# Patient Record
Sex: Female | Born: 1975 | Race: Black or African American | Hispanic: No | Marital: Married | Smoking: Never smoker
Health system: Southern US, Community
[De-identification: ages and names within clinical notes are randomized; demographics above are authoritative.]

## PROBLEM LIST (undated history)

## (undated) DIAGNOSIS — I10 Essential (primary) hypertension: Secondary | ICD-10-CM

## (undated) DIAGNOSIS — D219 Benign neoplasm of connective and other soft tissue, unspecified: Secondary | ICD-10-CM

## (undated) DIAGNOSIS — N920 Excessive and frequent menstruation with regular cycle: Secondary | ICD-10-CM

## (undated) DIAGNOSIS — E669 Obesity, unspecified: Secondary | ICD-10-CM

## (undated) DIAGNOSIS — R232 Flushing: Secondary | ICD-10-CM

## (undated) DIAGNOSIS — J329 Chronic sinusitis, unspecified: Secondary | ICD-10-CM

## (undated) DIAGNOSIS — E785 Hyperlipidemia, unspecified: Secondary | ICD-10-CM

## (undated) DIAGNOSIS — Z309 Encounter for contraceptive management, unspecified: Secondary | ICD-10-CM

## (undated) HISTORY — DX: Hyperlipidemia, unspecified: E78.5

## (undated) HISTORY — DX: Obesity, unspecified: E66.9

## (undated) HISTORY — DX: Encounter for contraceptive management, unspecified: Z30.9

## (undated) HISTORY — DX: Benign neoplasm of connective and other soft tissue, unspecified: D21.9

## (undated) HISTORY — DX: Chronic sinusitis, unspecified: J32.9

## (undated) HISTORY — DX: Flushing: R23.2

## (undated) HISTORY — DX: Essential (primary) hypertension: I10

## (undated) HISTORY — DX: Excessive and frequent menstruation with regular cycle: N92.0

---

## 2005-12-16 ENCOUNTER — Inpatient Hospital Stay (HOSPITAL_COMMUNITY): Admission: EM | Admit: 2005-12-16 | Discharge: 2005-12-22 | Payer: Self-pay | Admitting: Emergency Medicine

## 2005-12-19 ENCOUNTER — Encounter (INDEPENDENT_AMBULATORY_CARE_PROVIDER_SITE_OTHER): Payer: Self-pay | Admitting: Specialist

## 2005-12-28 HISTORY — PX: CHOLECYSTECTOMY: SHX55

## 2007-04-18 ENCOUNTER — Other Ambulatory Visit: Admission: RE | Admit: 2007-04-18 | Discharge: 2007-04-18 | Payer: Self-pay | Admitting: Obstetrics and Gynecology

## 2008-05-24 ENCOUNTER — Other Ambulatory Visit: Admission: RE | Admit: 2008-05-24 | Discharge: 2008-05-24 | Payer: Self-pay | Admitting: Obstetrics and Gynecology

## 2009-06-06 ENCOUNTER — Other Ambulatory Visit: Admission: RE | Admit: 2009-06-06 | Discharge: 2009-06-06 | Payer: Self-pay | Admitting: Obstetrics and Gynecology

## 2010-06-08 ENCOUNTER — Other Ambulatory Visit: Admission: RE | Admit: 2010-06-08 | Discharge: 2010-06-08 | Payer: Self-pay | Admitting: Obstetrics and Gynecology

## 2010-12-15 NOTE — H&P (Signed)
NAMEARNELLA, Tracy Marks NO.:  [id number]   MEDICAL RECORD NO.:  [id number]          PATIENT TYPE:  INP   LOCATION:  A309                          FACILITY:  [facility]   PHYSICIAN:  Dalia Heading, M.D.  DATE OF BIRTH:  1975-09-16   DATE OF ADMISSION:  12/16/2005  DATE OF DISCHARGE:  LH                                HISTORY & PHYSICAL   .   REASON FOR ADMISSION:  Biliary colic.   HISTORY OF PRESENT ILLNESS:  The patient is a 35 year old black female who  presents with a 48-hour history of worsening right upper quadrant abdominal  pain and nausea.  She states that she has been on amoxicillin for acute  sinusitis but started 2 days ago to have right upper quadrant abdominal pain  with nausea and vomiting.  She presented the emergency room for further  evaluation and treatment.  She was noted to have elevated liver enzyme  tests.  She was admitted to the hospital for further evaluation treatment.   PAST MEDICAL HISTORY:  Is as noted above.   PAST SURGICAL HISTORY:  C sections.   CURRENT MEDICATIONS:  1.  Zyrtec 1 tablet p.o. twice daily.  2.  Amoxicillin 1.5 grams p.o. 3 times a day.  3.  Birth control pills.   ALLERGIES:  No known drug allergies.   REVIEW OF SYSTEMS:  Noncontributory.   PHYSICAL EXAMINATION:  GENERAL: The patient is a well-developed, well-  nourished black female in no acute distress.  HEENT: Examination reveals no scleral icterus.  LUNGS:  Clear to auscultation.  Equal breath sounds bilaterally.  HEART: Examination reveals regular rate and rhythm without history, S4,  murmurs.  ABDOMEN:  Soft with mild tenderness noted in the right upper quadrant  palpation.  No hepatosplenomegaly or masses noted.   White blood cell count 10.8, hematocrit 35, platelet count 693,000.  MET-7  is within normal limits.  SGOT 145, SGPT 158, alkaline phosphatase 288,  total bilirubin 2.2 with a direct bilirubin elevation of 1.5.  Urine  pregnancy test is  negative.   IMPRESSION:  Abdominal pain possibly secondary to cholelithiasis with  concern for choledocholithiasis.   PLAN:  The patient will be admitted to hospital for intravenous hydration,  control of nausea and pain, and IV Zosyn.  She will be getting an ultrasound  of the abdomen tomorrow in the a.m.  Further management pending those  results.      Dalia Heading, M.D.  Electronically Signed     MAJ/MEDQ  D:  12/16/2005  T:  12/16/2005  Job:  [id number]   cc:   Patrica Duel, M.D.  Fax: [phone]

## 2010-12-15 NOTE — Op Note (Signed)
Tracy Marks, Tracy Marks NO.:  [id number]   MEDICAL RECORD NO.:  [id number]          PATIENT TYPE:  INP   LOCATION:  A309                          FACILITY:  [facility]   PHYSICIAN:  Dalia Heading, M.D.  DATE OF BIRTH:  07-Feb-1976   DATE OF PROCEDURE:  12/19/2005  DATE OF DISCHARGE:                                 OPERATIVE REPORT   PREOPERATIVE DIAGNOSIS:  Cholecystitis, cholelithiasis, gallstone  pancreatitis.   POSTOPERATIVE DIAGNOSIS:  Cholecystitis, cholelithiasis, gallstone  pancreatitis.   PROCEDURE:  Laparoscopic cholecystectomy.   SURGEON:  Dalia Heading, M.D.   ASSISTANT:  Bernerd Limbo. Leona Carry, M.D.   ANESTHESIA:  General endotracheal.   INDICATIONS:  The patient is a 35 year old black female who presented with  right upper quadrant abdominal pain to the emergency room.  Ultrasound of  the gallbladder revealed cholelithiasis with thickened gallbladder wall and  dilated common bile duct.  There was a question of a common duct stone.  She  was also noted to have gallstone pancreatitis.  She subsequently has  defervesced and her liver enzyme tests as well as amylase and lipase have  been returning to normal.  Her total bilirubin level has been normal over  the past 24 hours.  The patient now comes to the operating room for  laparoscopic cholecystectomy with possible cholangiograms.  The risks and  benefits of the procedure including bleeding, infection, hepatobiliary  injury, the possibility of an open procedure were fully explained to the  patient who gave informed consent.   PROCEDURE NOTE:  The patient was placed in the supine position.  After  induction of general endotracheal anesthesia, the abdomen was prepped and  draped using the usual sterile technique with Betadine.  Surgical site  confirmation was performed.  A supraumbilical incision was made down to the  fascia.  A Veress needle was introduced into the abdominal cavity and  confirmation of  placement was done using the saline drop test.  The abdomen  was then insufflated to 16 mmHg pressure.  An 11-mm trocar was introduced  into the abdominal cavity under direct visualization without difficulty.  The patient was placed in reversed Trendelenburg position.  An additional 11-  mm trocar was placed in the epigastric region and a 5-mm trocars were placed  in the right upper quadrant and right flank regions.  The liver was  inspected and noted to be within normal limits.  The gallbladder was noted  to be significantly inflamed and encased in omentum.  The omentum had to be  freed away using both blunt and sharp dissection.  The gallbladder wall was  significantly thickened with gangrenous changes.  The dissection was then  taken down to the infundibulum.  It was difficult to fully assess all  tubular structures.  Care was taken to the dissection high on the  infundibulum.  A cholangiogram could not be performed due to the significant  inflammatory response around the cystic duct.  The cystic duct was,  likewise, ligated and divided.  This cystic artery was, likewise, ligated  divided.  The gallbladder was  freed away from the gallbladder fossa using  Bovie electrocautery.  The gallbladder was delivered through the epigastric  trocar site using the EndoCatch bag.  The gallbladder fossa was copiously  irrigated with normal saline.  Surgicel was placed in the gallbladder fossa.  A #10 flat Jackson-Pratt drain was placed into the gallbladder fossa and  brought out through one of the 5-mm trocar sites.  It was secured at the  skin level using a 3-0 nylon interrupted suture.  The epigastric fascia as  well as suprapubic fascia were reapproximated using 0 Vicryl interrupted  sutures.  All skin incisions were closed using staples.  Betadine ointment  and dry sterile dressings were applied.  All tape and needle counts were  correct at the end of the procedure.  The patient was extubated in  the  operating room and went back to the recovery room awake in stable condition.   COMPLICATIONS:  None.   SPECIMEN:  Gallbladder and stones.   BLOOD LOSS:  100 mL.   DRAINS:  Jackson-Pratt drains in the subhepatic space.      Dalia Heading, M.D.  Electronically Signed     MAJ/MEDQ  D:  12/19/2005  T:  12/19/2005  Job:  [id number]   cc:   Patrica Duel, M.D.  Fax: [phone]

## 2010-12-15 NOTE — Discharge Summary (Signed)
[name], [name] NO.:  [id number]   MEDICAL RECORD NO.:  [id number]          PATIENT TYPE:  INP   LOCATION:  A309                          FACILITY:  [facility]   PHYSICIAN:  Dalia Heading, M.D.  DATE OF BIRTH:  03/26/1976   DATE OF ADMISSION:  12/16/2005  DATE OF DISCHARGE:  05/26/2007LH                                 DISCHARGE SUMMARY   AGE:  35 years old.   HOSPITAL COURSE SUMMARY:  The patient is a 35 year old black female who  presented with right upper quadrant abdominal pain, nausea, vomiting.  Ultrasound of the gallbladder was performed which revealed cholelithiasis  with a thickened gallbladder wall and dilated common bile duct.  There was a  question of a common bile duct stone.  Her liver enzyme tests, amylase,  lipase were all significantly elevated.  Her diagnosis was gallstone  pancreatitis.  Once her pancreatic symptoms decreased, she subsequently  underwent a laparoscopic cholecystectomy.  A cholangiogram could not be  performed due to significant inflammation and gangrene of the gallbladder.  The surgery was performed on Dec 19, 2005.   Her postoperative course was for the most part unremarkable.  Her diet was  advanced without difficulty to a low-fat diet once her bowel function  returned.  Her liver enzyme tests have returned to normal except for a  alkaline phosphatase elevation of 160.  Her total bilirubin is 0.6.  Her  amylase is 231, lipase 76, albumin 2.   The patient is being discharged home in good and improving condition.   DISCHARGE INSTRUCTIONS:  The patient is to follow up with Dr. Franky Macho  on Dec 27, 2005.  She is instructed to have a low-fat diet.   DISCHARGE MEDICATIONS:  1.  Vicodin 5 mg 1 to 2 tablets p.o. q. 4 h p.r.n. pain.  2.  Prilosec 20 mg p.o. daily.  3.  She is to resume her other medications as previously prescribed.   PRINCIPAL DIAGNOSIS:  Gallstone pancreatitis.   PRINCIPAL PROCEDURE:  Laparoscopic  cholecystectomy on Dec 19, 2005.      Dalia Heading, M.D.  Electronically Signed     MAJ/MEDQ  D:  12/22/2005  T:  12/23/2005  Job:  [id number]   cc:   Patrica Duel, M.D.  Fax: [phone]

## 2011-04-02 ENCOUNTER — Emergency Department (HOSPITAL_COMMUNITY)
Admission: EM | Admit: 2011-04-02 | Discharge: 2011-04-02 | Disposition: A | Discharge status: Home / Self Care | Payer: Managed Care, Other (non HMO) | Attending: Emergency Medicine | Admitting: Emergency Medicine

## 2011-04-02 ENCOUNTER — Encounter: Payer: Self-pay | Admitting: Emergency Medicine

## 2011-04-02 DIAGNOSIS — H60399 Other infective otitis externa, unspecified ear: Secondary | ICD-10-CM

## 2011-04-02 DIAGNOSIS — H9209 Otalgia, unspecified ear: Secondary | ICD-10-CM | POA: Insufficient documentation

## 2011-04-02 MED ORDER — ANTIPYRINE-BENZOCAINE 5.4-1.4 % OT SOLN
2.0000 [drp] | Freq: Once | OTIC | Status: AC
  Administered 2011-04-02: 2 [drp] via OTIC
  Filled 2011-04-02: qty 10

## 2011-04-02 MED ORDER — NEOMYCIN-POLYMYXIN-HC 3.5-10000-1 OT SOLN
2.0000 [drp] | Freq: Four times a day (QID) | OTIC | Status: DC
  Administered 2011-04-02: 2 [drp] via OTIC
  Filled 2011-04-02: qty 10

## 2011-04-02 MED ORDER — IBUPROFEN 800 MG PO TABS
800.0000 mg | ORAL_TABLET | Freq: Once | ORAL | Status: AC
  Administered 2011-04-02: 800 mg via ORAL
  Filled 2011-04-02: qty 1

## 2011-04-02 MED ORDER — IBUPROFEN 800 MG PO TABS: 800.0000 mg | ORAL_TABLET | Freq: Three times a day (TID) | ORAL | Status: AC

## 2011-04-02 NOTE — ED Provider Notes (Signed)
History     CSN: [id number] Arrival date & time: 04/02/2011  3:04 AM  Chief Complaint  Patient presents with  . Sinusitis   HPI Comments: Seen 35  Patient is a 35 y.o. female presenting with ear pain. The history is provided by the patient.  Otalgia This is a new problem. The current episode started yesterday. There is pain in the left ear. The problem occurs constantly. The problem has not changed since onset.There has been no fever. The pain is at a severity of 7/10. The pain is moderate. Pertinent negatives include no ear discharge. Associated symptoms comments: Left facial pain, left jaw pain, swelling to left face. Marland Kitchen    History reviewed. No pertinent past medical history.  Past Surgical History  Procedure Date  . Cholecystectomy     No family history on file.  History  Substance Use Topics  . Smoking status: Never Smoker   . Smokeless tobacco: Not on file  . Alcohol Use: No    OB History    Grav Para Term Preterm Abortions TAB SAB Ect Mult Living                  Review of Systems  HENT: Positive for ear pain. Negative for ear discharge.   All other systems reviewed and are negative.    Physical Exam  BP 141/86  Pulse 87  Temp(Src) 98.5 F (36.9 C) (Oral)  Resp 20  Ht 5\' 4"  (1.626 m)  Wt 190 lb (86.183 kg)  BMI 32.61 kg/m2  SpO2 100%  LMP 03/26/2011  Physical Exam  Nursing note and vitals reviewed. Constitutional: She is oriented to person, place, and time. She appears well-developed and well-nourished.  HENT:  Head: Normocephalic and atraumatic.  Right Ear: External ear normal.  Mouth/Throat: Oropharynx is clear and moist.       Left ear canal with swelling, erythema. Unable to visualize the tympanic membrane. Poor dentition. No obvious abscess. No swollen gums.  Eyes: EOM are normal.  Neck: Normal range of motion.       Shoddy nodes  left neck  Cardiovascular: Normal rate, normal heart sounds and intact distal pulses.   Pulmonary/Chest:  Effort normal and breath sounds normal.  Musculoskeletal: Normal range of motion.  Lymphadenopathy:    She has cervical adenopathy.  Neurological: She is alert and oriented to person, place, and time.  Skin: Skin is warm and dry.    ED Course  Procedures  Patient with left facial pain and ear pain. Has swelling to external canal c/w otitis externa. Antibiotics and auralgan given while in ER. Ibuprofen for pain given. Pt stable in ED with no significant deterioration in condition.  MDM Reviewed: nursing note and vitals      Nicoletta Dress. Colon Branch, MD 04/02/11 [phone]

## 2011-04-02 NOTE — ED Notes (Signed)
Patient c/o sinus infection for several weeks; states has been taking an antibiotic and doesn't feel that it is helping.  Patient states pain has started to hurt left side of face and teeth.

## 2011-06-11 ENCOUNTER — Other Ambulatory Visit: Payer: Self-pay | Admitting: Adult Health

## 2011-06-11 ENCOUNTER — Other Ambulatory Visit (HOSPITAL_COMMUNITY)
Admission: RE | Admit: 2011-06-11 | Discharge: 2011-06-11 | Disposition: A | Discharge status: Home / Self Care | Payer: Managed Care, Other (non HMO) | Source: Ambulatory Visit | Attending: Obstetrics and Gynecology | Admitting: Obstetrics and Gynecology

## 2011-06-11 DIAGNOSIS — Z113 Encounter for screening for infections with a predominantly sexual mode of transmission: Secondary | ICD-10-CM | POA: Insufficient documentation

## 2011-06-11 DIAGNOSIS — Z01419 Encounter for gynecological examination (general) (routine) without abnormal findings: Secondary | ICD-10-CM | POA: Insufficient documentation

## 2012-06-13 ENCOUNTER — Other Ambulatory Visit: Payer: Self-pay | Admitting: Adult Health

## 2012-06-13 ENCOUNTER — Other Ambulatory Visit (HOSPITAL_COMMUNITY)
Admission: RE | Admit: 2012-06-13 | Discharge: 2012-06-13 | Disposition: A | Discharge status: Home / Self Care | Payer: Managed Care, Other (non HMO) | Source: Ambulatory Visit | Attending: Obstetrics and Gynecology | Admitting: Obstetrics and Gynecology

## 2012-06-13 DIAGNOSIS — Z01419 Encounter for gynecological examination (general) (routine) without abnormal findings: Secondary | ICD-10-CM | POA: Insufficient documentation

## 2012-06-13 DIAGNOSIS — Z1151 Encounter for screening for human papillomavirus (HPV): Secondary | ICD-10-CM | POA: Insufficient documentation

## 2013-06-16 ENCOUNTER — Other Ambulatory Visit: Payer: Self-pay | Admitting: Adult Health

## 2013-06-17 ENCOUNTER — Encounter: Payer: Self-pay | Admitting: Adult Health

## 2013-06-17 ENCOUNTER — Ambulatory Visit (INDEPENDENT_AMBULATORY_CARE_PROVIDER_SITE_OTHER): Payer: Managed Care, Other (non HMO) | Admitting: Adult Health

## 2013-06-17 VITALS — BP 138/78 | HR 78 | Ht 64.0 in | Wt 237.0 lb

## 2013-06-17 DIAGNOSIS — Z01419 Encounter for gynecological examination (general) (routine) without abnormal findings: Secondary | ICD-10-CM

## 2013-06-17 DIAGNOSIS — Z309 Encounter for contraceptive management, unspecified: Secondary | ICD-10-CM

## 2013-06-17 HISTORY — DX: Encounter for contraceptive management, unspecified: Z30.9

## 2013-06-17 MED ORDER — NORETHIN-ETH ESTRAD TRIPHASIC 0.5/0.75/1-35 MG-MCG PO TABS: 1.0000 | ORAL_TABLET | Freq: Every day | ORAL | Status: DC

## 2013-06-17 NOTE — Patient Instructions (Signed)
Physical in 1 year Mammogram at 38 [phone] Call prn problems

## 2013-06-17 NOTE — Progress Notes (Signed)
Patient ID: Tracy Marks, female   DOB: [birth year], 37 y.o.   MRN: [id number] History of Present Illness: Tracy Marks is a 36 year old black female in for a physical.She had a normal pap 06/13/12 with negative HPV.   Current Medications, Allergies, Past Medical History, Past Surgical History, Family History and Social History were reviewed in [name] record.     Review of Systems Patient denies any headaches, blurred vision, shortness of breath, chest pain, abdominal pain, problems with bowel movements, urination, or intercourse.No joint pain or mood changes. Happy with her OCs.   BP 138/78  Pulse 78  Ht 5\' 4"  (1.626 m)  Wt 237 lb (107.502 kg)  BMI 40.66 kg/m2  LMP 06/16/2013 Physical Exam: General:  Well developed, well nourished, no acute distress Skin:  Warm and dry Neck:  Midline trachea, normal thyroid Lungs; Clear to auscultation bilaterally Breast:  No dominant palpable mass, retraction, or nipple discharge Cardiovascular: Regular rate and rhythm Abdomen:  Soft, non tender, no hepatosplenomegaly Pelvic:  External genitalia is normal in appearance.  The vagina is normal in appearance.Has period like blood. The cervix is smooth.  Uterus is felt to be normal size, shape, and contour.  No                adnexal masses or tenderness noted. Extremities:  No swelling or varicosities noted Psych:  No mood changes, alert and cooperative, seems happy   Impression: Yearly gyn exam no pap Contraceptive management    Plan: Refilled Dasetta 777 x 1 year Physical in 1 year Mammogram at 38 Call prn problems

## 2013-12-11 ENCOUNTER — Other Ambulatory Visit: Payer: Self-pay | Admitting: Adult Health

## 2013-12-11 DIAGNOSIS — Z1231 Encounter for screening mammogram for malignant neoplasm of breast: Secondary | ICD-10-CM

## 2014-02-01 ENCOUNTER — Telehealth: Payer: Self-pay | Admitting: *Deleted

## 2014-02-01 NOTE — Telephone Encounter (Signed)
Pt concerned because her period for the past two months have been a little heavier and longer than normal.  Pt states she is talking BCP and has her period when she is due to but there has been a little change in flow.  She is up to date on pap/PE and is not having any other issues, advised pt to inform us if develops other symptoms or starts having pain or excessive bleeding.  Pt verbalized understanding.

## 2014-02-26 ENCOUNTER — Ambulatory Visit (HOSPITAL_COMMUNITY)
Admission: RE | Admit: 2014-02-26 | Discharge: 2014-02-26 | Disposition: A | Discharge status: Home / Self Care | Payer: Managed Care, Other (non HMO) | Source: Ambulatory Visit | Attending: Adult Health | Admitting: Adult Health

## 2014-02-26 DIAGNOSIS — Z1231 Encounter for screening mammogram for malignant neoplasm of breast: Secondary | ICD-10-CM | POA: Insufficient documentation

## 2014-05-12 ENCOUNTER — Emergency Department (INDEPENDENT_AMBULATORY_CARE_PROVIDER_SITE_OTHER)
Admission: EM | Admit: 2014-05-12 | Discharge: 2014-05-12 | Disposition: A | Discharge status: Home / Self Care | Payer: Managed Care, Other (non HMO) | Source: Home / Self Care | Attending: Emergency Medicine | Admitting: Emergency Medicine

## 2014-05-12 ENCOUNTER — Encounter (HOSPITAL_COMMUNITY): Payer: Self-pay | Admitting: Emergency Medicine

## 2014-05-12 DIAGNOSIS — S40019A Contusion of unspecified shoulder, initial encounter: Secondary | ICD-10-CM

## 2014-05-12 DIAGNOSIS — M25562 Pain in left knee: Secondary | ICD-10-CM

## 2014-05-12 MED ORDER — IBUPROFEN 800 MG PO TABS: 800.0000 mg | ORAL_TABLET | Freq: Three times a day (TID) | ORAL | Status: DC

## 2014-05-12 MED ORDER — HYDROCODONE-ACETAMINOPHEN 5-325 MG PO TABS: 1.0000 | ORAL_TABLET | Freq: Four times a day (QID) | ORAL | Status: DC | PRN

## 2014-05-12 MED ORDER — CYCLOBENZAPRINE HCL 5 MG PO TABS: 5.0000 mg | ORAL_TABLET | Freq: Three times a day (TID) | ORAL | Status: DC | PRN
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
## 2014-05-12 NOTE — Discharge Instructions (Signed)
You have some bruising and muscle strain. You will feel more stiff and sore tomorrow. Use ice on the left shoulder and knee. Alternate heat and ice on the neck and right forearm.  Take ibuprofen 800mg  every 8 hours as needed. Take flexeril (muscle relaxer) every 8 hours as needed.  Do not drive while taking this medication. Use norco (pain medicine) every 4 hours as needed for severe pain.  Do not drive while taking this medication.  If you develop severe pain or new symptoms, please go to the emergency room.
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
## 2014-05-21 ENCOUNTER — Other Ambulatory Visit: Payer: Self-pay | Admitting: Adult Health

## 2014-05-31 ENCOUNTER — Encounter (HOSPITAL_COMMUNITY): Payer: Self-pay | Admitting: Emergency Medicine

## 2014-06-22 ENCOUNTER — Ambulatory Visit (INDEPENDENT_AMBULATORY_CARE_PROVIDER_SITE_OTHER): Payer: Managed Care, Other (non HMO) | Admitting: Adult Health

## 2014-06-22 ENCOUNTER — Encounter: Payer: Self-pay | Admitting: Adult Health

## 2014-06-22 VITALS — BP 140/78 | HR 76 | Ht 64.5 in | Wt 229.5 lb

## 2014-06-22 DIAGNOSIS — Z3041 Encounter for surveillance of contraceptive pills: Secondary | ICD-10-CM

## 2014-06-22 DIAGNOSIS — Z1212 Encounter for screening for malignant neoplasm of rectum: Secondary | ICD-10-CM

## 2014-06-22 DIAGNOSIS — Z01419 Encounter for gynecological examination (general) (routine) without abnormal findings: Secondary | ICD-10-CM

## 2014-06-22 DIAGNOSIS — R195 Other fecal abnormalities: Secondary | ICD-10-CM | POA: Insufficient documentation

## 2014-06-22 LAB — HEMOCCULT GUIAC POC 1CARD (OFFICE): FECAL OCCULT BLD: POSITIVE

## 2014-06-22 NOTE — Patient Instructions (Signed)
pap and physical in 1 year Mammogram yearly Do 3 hemoccult cards

## 2014-06-22 NOTE — Progress Notes (Signed)
Patient ID: Tracy Marks, female   DOB: 05/15/1976, 38 y.o.   MRN: [id number] History of Present Illness: Tracy Marks is a 38 year old black female in for gyn exam.she had a normal pap with negative HPV 06/13/12.She is taking Augmentin for sinus infection.Had labs and flu shot at work.   Current Medications, Allergies, Past Medical History, Past Surgical History, Family History and Social History were reviewed in [employer] record.     Review of Systems: Patient denies any headaches, blurred vision, shortness of breath, chest pain, abdominal pain, problems with bowel movements, urination, or intercourse. No joint pain or mood swings.She is happy with the pill.    Physical Exam:BP 140/78 mmHg  Pulse 76  Ht 5' 4.5" (1.638 m)  Wt 229 lb 8 oz (104.101 kg)  BMI 38.80 kg/m2  LMP 06/15/2014 General:  Well developed, well nourished, no acute distress Skin:  Warm and dry Neck:  Midline trachea, normal thyroid Lungs; Clear to auscultation bilaterally Breast:  No dominant palpable mass, retraction, or nipple discharge Cardiovascular: Regular rate and rhythm Abdomen:  Soft, non tender, no hepatosplenomegaly Pelvic:  External genitalia is normal in appearance.  The vagina is normal in appearance, has period like blood. The cervix is bulbous.  Uterus is felt to be normal size, shape, and contour.  No                adnexal masses or tenderness noted. Rectal: Good sphincter tone, no polyps, internal hemorrhoids felt.  Hemoccult positive. Extremities:  No swelling or varicosities noted Psych:  No mood changes,alert and cooperative,seems happy   Impression: Well woman exam no pap Contraceptive management +hemoccult     Plan: Gave 3 hemoccult cards to do at home Pap and physical in 1 year Mammogram yearly Continue OCs, has refills

## 2014-12-29 ENCOUNTER — Other Ambulatory Visit (HOSPITAL_COMMUNITY): Payer: Self-pay | Admitting: Physician Assistant

## 2014-12-29 DIAGNOSIS — Z1231 Encounter for screening mammogram for malignant neoplasm of breast: Secondary | ICD-10-CM

## 2015-02-28 ENCOUNTER — Ambulatory Visit (HOSPITAL_COMMUNITY)
Admission: RE | Admit: 2015-02-28 | Discharge: 2015-02-28 | Disposition: A | Discharge status: Home / Self Care | Payer: Managed Care, Other (non HMO) | Source: Ambulatory Visit | Attending: Physician Assistant | Admitting: Physician Assistant

## 2015-02-28 DIAGNOSIS — Z1231 Encounter for screening mammogram for malignant neoplasm of breast: Secondary | ICD-10-CM | POA: Diagnosis not present

## 2015-04-21 ENCOUNTER — Other Ambulatory Visit: Payer: Self-pay | Admitting: Adult Health

## 2015-06-27 ENCOUNTER — Other Ambulatory Visit: Payer: Managed Care, Other (non HMO) | Admitting: Adult Health

## 2015-07-01 ENCOUNTER — Ambulatory Visit (INDEPENDENT_AMBULATORY_CARE_PROVIDER_SITE_OTHER): Payer: Managed Care, Other (non HMO) | Admitting: Adult Health

## 2015-07-01 ENCOUNTER — Encounter: Payer: Self-pay | Admitting: Adult Health

## 2015-07-01 ENCOUNTER — Other Ambulatory Visit (HOSPITAL_COMMUNITY)
Admission: RE | Admit: 2015-07-01 | Discharge: 2015-07-01 | Disposition: A | Discharge status: Home / Self Care | Payer: Managed Care, Other (non HMO) | Source: Ambulatory Visit | Attending: Adult Health | Admitting: Adult Health

## 2015-07-01 VITALS — BP 120/80 | HR 78 | Ht 64.0 in | Wt 238.0 lb

## 2015-07-01 DIAGNOSIS — Z01419 Encounter for gynecological examination (general) (routine) without abnormal findings: Secondary | ICD-10-CM | POA: Diagnosis not present

## 2015-07-01 DIAGNOSIS — N951 Menopausal and female climacteric states: Secondary | ICD-10-CM | POA: Insufficient documentation

## 2015-07-01 DIAGNOSIS — Z1212 Encounter for screening for malignant neoplasm of rectum: Secondary | ICD-10-CM

## 2015-07-01 DIAGNOSIS — Z3041 Encounter for surveillance of contraceptive pills: Secondary | ICD-10-CM

## 2015-07-01 DIAGNOSIS — Z1151 Encounter for screening for human papillomavirus (HPV): Secondary | ICD-10-CM | POA: Insufficient documentation

## 2015-07-01 DIAGNOSIS — R232 Flushing: Secondary | ICD-10-CM | POA: Insufficient documentation

## 2015-07-01 DIAGNOSIS — N920 Excessive and frequent menstruation with regular cycle: Secondary | ICD-10-CM

## 2015-07-01 HISTORY — DX: Flushing: R23.2

## 2015-07-01 HISTORY — DX: Excessive and frequent menstruation with regular cycle: N92.0

## 2015-07-01 LAB — HEMOCCULT GUIAC POC 1CARD (OFFICE): Fecal Occult Blood, POC: NEGATIVE

## 2015-07-01 MED ORDER — NORETHIN-ETH ESTRAD TRIPHASIC 0.5/0.75/1-35 MG-MCG PO TABS: ORAL_TABLET | ORAL | Status: DC

## 2015-07-01 NOTE — Patient Instructions (Signed)
Physical in  1 year Mammogram yearly Return next week for [place] Perimenopause Perimenopause is the time when your body begins to move into the menopause (no menstrual period for 12 straight months). It is a natural process. Perimenopause can begin 2-8 years before the menopause and usually lasts for 1 year after the menopause. During this time, your ovaries may or may not produce an egg. The ovaries vary in their production of estrogen and progesterone hormones each month. This can cause irregular menstrual periods, difficulty getting pregnant, vaginal bleeding between periods, and uncomfortable symptoms. CAUSES  Irregular production of the ovarian hormones, estrogen and progesterone, and not ovulating every month.  Other causes include:  Tumor of the pituitary gland in the brain.  Medical disease that affects the ovaries.  Radiation treatment.  Chemotherapy.  Unknown causes.  Heavy smoking and excessive alcohol intake can bring on perimenopause sooner. SIGNS AND SYMPTOMS   Hot flashes.  Night sweats.  Irregular menstrual periods.  Decreased sex drive.  Vaginal dryness.  Headaches.  Mood swings.  Depression.  Memory problems.  Irritability.  Tiredness.  Weight gain.  Trouble getting pregnant.  The beginning of losing bone cells (osteoporosis).  The beginning of hardening of the arteries (atherosclerosis). DIAGNOSIS  Your health care provider will make a diagnosis by analyzing your age, menstrual history, and symptoms. He or she will do a physical exam and note any changes in your body, especially your female organs. Female hormone tests may or may not be helpful depending on the amount of female hormones you produce and when you produce them. However, other hormone tests may be helpful to rule out other problems. TREATMENT  In some cases, no treatment is needed. The decision on whether treatment is necessary during the perimenopause should be made by you and your  health care provider based on how the symptoms are affecting you and your lifestyle. Various treatments are available, such as:  Treating individual symptoms with a specific medicine for that symptom.  Herbal medicines that can help specific symptoms.  Counseling.  Group therapy. HOME CARE INSTRUCTIONS   Keep track of your menstrual periods (when they occur, how heavy they are, how long between periods, and how long they last) as well as your symptoms and when they started.  Only take over-the-counter or prescription medicines as directed by your health care provider.  Sleep and rest.  Exercise.  Eat a diet that contains calcium (good for your bones) and soy (acts like the estrogen hormone).  Do not smoke.  Avoid alcoholic beverages.  Take vitamin supplements as recommended by your health care provider. Taking vitamin E may help in certain cases.  Take calcium and vitamin D supplements to help prevent bone loss.  Group therapy is sometimes helpful.  Acupuncture may help in some cases. SEEK MEDICAL CARE IF:   You have questions about any symptoms you are having.  You need a referral to a specialist (gynecologist, psychiatrist, or psychologist). SEEK IMMEDIATE MEDICAL CARE IF:   You have vaginal bleeding.  Your period lasts longer than 8 days.  Your periods are recurring sooner than 21 days.  You have bleeding after intercourse.  You have severe depression.  You have pain when you urinate.  You have severe headaches.  You have vision problems.   This information is not intended to replace advice given to you by your health care provider. Make sure you discuss any questions you have with your health care provider.   Document Released: 08/23/2004 Document  Revised: 08/06/2014 Document Reviewed: 02/12/2013 Elsevier Interactive Patient Education [employer].

## 2015-07-01 NOTE — Progress Notes (Signed)
Patient ID: Tracy Marks, female   DOB: 15-Jun-1976, 39 y.o.   MRN: [id number] History of Present Illness: Tracy Marks is a 40 year old black female in for a well woman gyn exam and pap.She says her periods are heavy at times with clots and cramps and she has hot flashes and night sweats.She got labs at work and flu shot in October at [place]. PCP [facility].   Current Medications, Allergies, Past Medical History, Past Surgical History, Family History and Social History were reviewed in [employer] record.     Review of Systems: Patient denies any headaches, hearing loss, fatigue, blurred vision, shortness of breath, chest pain, abdominal pain, problems with bowel movements, urination, or intercourse. No joint pain or mood swings.See HPI for positives.    Physical Exam:BP 120/80 mmHg  Pulse 78  Ht 5\' 4"  (1.626 m)  Wt 238 lb (107.956 kg)  BMI 40.83 kg/m2  LMP 06/14/2015 General:  Well developed, well nourished, no acute distress Skin:  Warm and dry Neck:  Midline trachea, normal thyroid, good ROM, no lymphadenopathy Lungs; Clear to auscultation bilaterally Breast:  No dominant palpable mass, retraction, or nipple discharge Cardiovascular: Regular rate and rhythm Abdomen:  Soft, non tender, no hepatosplenomegaly Pelvic:  External genitalia is normal in appearance, no lesions.  The vagina is normal in appearance. Urethra has no lesions or masses. The cervix is bulbous,pap with HPV performed.  Uterus is felt to be ULNS normal, shape, and contour.  No adnexal masses or tenderness noted.Bladder is non tender, no masses felt. Rectal: Good sphincter tone, no polyps, or hemorrhoids felt.  Hemoccult negative. Extremities/musculoskeletal:  No swelling or varicosities noted, no clubbing or cyanosis Psych:  No mood changes, alert and cooperative,seems happy   Impression: Well woman gyn exam and pap Heavy periods  Hot flashes Contraceptive management Peri menopausal     Plan: Refilled Dasetta 7/7/7 for 1 year Physical in 1 year Mammogram yearly Labs at work  Return next week for gyn [place]

## 2015-07-04 LAB — CYTOLOGY - PAP

## 2015-07-05 ENCOUNTER — Ambulatory Visit (INDEPENDENT_AMBULATORY_CARE_PROVIDER_SITE_OTHER): Payer: Managed Care, Other (non HMO)

## 2015-07-05 DIAGNOSIS — N854 Malposition of uterus: Secondary | ICD-10-CM

## 2015-07-05 DIAGNOSIS — N920 Excessive and frequent menstruation with regular cycle: Secondary | ICD-10-CM

## 2015-07-05 DIAGNOSIS — D252 Subserosal leiomyoma of uterus: Secondary | ICD-10-CM

## 2015-07-05 DIAGNOSIS — D250 Submucous leiomyoma of uterus: Secondary | ICD-10-CM

## 2015-07-05 NOTE — Progress Notes (Signed)
PELVIC US TA/TV: heterogenous anteverted uterus w/mult fibroids, (#1) submucosal fibroid distorting the fundal portion of the endometrium 4.6 x 3.7 x 3.9cm,(#2) subserosal fibroid post body 4.7 x3.2x2.9cm,(#3) post lt subserosal fibroid 2.3 x 4.4 x 2.4 cm,EEC 3.48mm,small amount of fluid w/in the endometrium,normal ov's bilat (mobile),no pain during ultrasound

## 2015-07-08 ENCOUNTER — Telehealth: Payer: Self-pay | Admitting: Adult Health

## 2015-07-08 NOTE — Telephone Encounter (Signed)
Pt aware of [place] results:            Uterus 11.3 x 7.7 x 6.7 cm, heterogenous anteverted uterus w/mult fibroids, (#1) submucosal fibroid distorting the fundal portion of the endometrium  4.6 x 3.7 x 3.9cm,(#2) subserosal fibroid post body 4.7 x3.2x2.9cm,(#3) post lt subserosal fibroid 2.3 x 4.4 x 2.4 cm  Endometrium 3.1 mm, symmetrical, small amount of fluid w/in the endometrium  Right ovary 2.0 x 2 x 4.4 cm, wnl  Left ovary 2.7 x 2.4 x 1.9 cm, wnl    Technician Comments:  PELVIC US TA/TV: heterogenous anteverted uterus w/mult fibroids, (#1) submucosal fibroid distorting the fundal portion of the endometrium 4.6 x 3.7 x 3.9cm,(#2) subserosal fibroid post body 4.7 x3.2x2.9cm,(#3) post lt subserosal fibroid 2.3 x 4.4 x 2.4 cm,EEC 3.7mm,small amount of fluid w/in the endometrium,normal ov's bilat (mobile),no pain during ultrasound  Make appt to discuss options, with Dr Glo Herring, surgery vs lupron, is already on OCs.

## 2015-07-08 NOTE — Telephone Encounter (Signed)
Left message I called 

## 2015-07-11 ENCOUNTER — Encounter: Payer: Self-pay | Admitting: Obstetrics and Gynecology

## 2015-07-11 ENCOUNTER — Ambulatory Visit (INDEPENDENT_AMBULATORY_CARE_PROVIDER_SITE_OTHER): Payer: Managed Care, Other (non HMO) | Admitting: Obstetrics and Gynecology

## 2015-07-11 VITALS — BP 148/90 | Ht 64.0 in | Wt 238.0 lb

## 2015-07-11 DIAGNOSIS — N920 Excessive and frequent menstruation with regular cycle: Secondary | ICD-10-CM

## 2015-07-11 NOTE — Progress Notes (Signed)
Patient ID: Tracy Marks, female   DOB: 12-20-1975, 39 y.o.   MRN: [id number] Pt here today to discuss options.

## 2015-07-11 NOTE — Progress Notes (Signed)
Patient ID: Tracy Marks, female   DOB: 1976/07/09, 39 y.o.   MRN: [id number]   [facility] Visit  Patient name: Tracy Marks MRN [id number]  Date of birth: February 05, 1976  CC & HPI:  Tracy Marks is a 39 y.o. female presenting today to discuss options for fibroids. Pt reports intermittent heavy menses at baseline noting that her stress levels affects  Pt reports each menstraul cycle is approximately 5-6 days. Pt complains of intermittent heavy menses, noting that her stress levels impacts the flow-- with higher stress levels correlating with heavier menses. Pt reports some improvement of heavy menses with oral contraception use. Pt denies anemia resulting from heavy menses or any pain with menses.   Pt had a pelvic US completed on 07/05/15 which showed:  Uterus: 11.3 x 7.7 x 6.7 cm, heterogenous anteverted uterus w/mult fibroids, (#1) submucosal fibroid distorting the fundal portion of the endometrium4.6 x 3.7 x 3.9cm,(#2) subserosal fibroid post body 4.7 x3.2x2.9cm,(#3) post lt subserosal fibroid 2.3 x 4.4 x 2.4 cm Endometrium: 3.1 mm, symmetrical, small amount of fluid w/in the endometrium Right ovary: 2.0 x 2 x 4.4 cm, wnl Left ovary: 2.7 x 2.4 x 1.9 cm, wnl ROS:  10 Systems reviewed and all are negative for acute change except as noted in the HPI. Pertinent History Reviewed:   Reviewed: Significant for heavy periods, cesarean section, cholecystectomy  Medical         Past Medical History  Diagnosis Date  . Sinusitis     and seasonal allergies  . Obesity   . Contraceptive management 06/17/2013  . Heavy periods 07/01/2015  . Hot flashes 07/01/2015                              Surgical Hx:    Past Surgical History  Procedure Laterality Date  . Cesarean section  10/25/1994  . Cholecystectomy  12/2005   Medications: Reviewed & Updated - see associated section                       Current outpatient prescriptions:  .  fexofenadine (ALLEGRA) 180 MG tablet, Take 180 mg by mouth  daily., Disp: , Rfl:  .  fluticasone (FLONASE) 50 MCG/ACT nasal spray, Place 1 spray into both nostrils daily., Disp: , Rfl:  .  Multiple Vitamins-Calcium (ONE-A-DAY WOMENS PO), Take 1 tablet by mouth daily., Disp: , Rfl:  .  norethindrone-ethinyl estradiol (DASETTA 7/7/7) 0.5/0.75/1-35 MG-MCG tablet, TAKE ONE (1) TABLET EACH DAY, Disp: 28 tablet, Rfl: 12   Social History: Reviewed -  reports that she has never smoked. She has never used smokeless tobacco.  Objective Findings:  Vitals: Blood pressure 148/90, height 5\' 4"  (1.626 m), weight 238 lb (107.956 kg), last menstrual period 06/14/2015.  Physical Examination: Presents for discussion only. Discussed with pt risks and benefits of endometrial ablation, IUD placement, radiofrequency ablation, uterine fibroid embolization, hysterectomy and wait and see before deciding on surgical options. Pt wishes to avoid hysterectomy or IUD placement. Pt states she is happy with current oral contraception. Pt had opportunity to ask questions and has no further questions at this time. Greater than 50% was spent in counseling and coordination of care with the patient. Discussion time: 30 minutes.   Assessment & Plan:  I personally performed the services described in this documentation, which was SCRIBED in my presence. The recorded information has been reviewed and considered accurate. It has been edited  as necessary during review. Jonnie Kind, MD   A:  1. Fibroid uterine enlargement.  2. Normal menses aqedequately controlled by OCP  P:  1. Rx for [facility] pills.  2 f/u prn increasing menses.     By signing my name below, I, Terressa Koyanagi, attest that this documentation has been prepared under the direction and in the presence of Mallory Shirk, MD. Electronically Signed: Terressa Koyanagi, ED Scribe. 07/11/2015. 10:38 AM.   I personally performed the services described in this documentation, which was SCRIBED in my presence. The recorded information has  been reviewed and considered accurate. It has been edited as necessary during review. Jonnie Kind, MD

## 2015-08-25 ENCOUNTER — Telehealth: Payer: Self-pay | Admitting: Adult Health

## 2015-08-25 DIAGNOSIS — D259 Leiomyoma of uterus, unspecified: Secondary | ICD-10-CM

## 2015-08-26 ENCOUNTER — Encounter: Payer: Self-pay | Admitting: Adult Health

## 2015-08-26 DIAGNOSIS — D259 Leiomyoma of uterus, unspecified: Secondary | ICD-10-CM | POA: Insufficient documentation

## 2015-08-26 DIAGNOSIS — D219 Benign neoplasm of connective and other soft tissue, unspecified: Secondary | ICD-10-CM

## 2015-08-26 HISTORY — DX: Benign neoplasm of connective and other soft tissue, unspecified: D21.9

## 2015-08-26 NOTE — Telephone Encounter (Signed)
Pt had questions about fibroids and treatment options, she will watch for now

## 2016-01-27 ENCOUNTER — Other Ambulatory Visit (HOSPITAL_COMMUNITY): Payer: Self-pay | Admitting: Family Medicine

## 2016-01-27 DIAGNOSIS — Z1231 Encounter for screening mammogram for malignant neoplasm of breast: Secondary | ICD-10-CM

## 2016-03-01 ENCOUNTER — Ambulatory Visit (HOSPITAL_COMMUNITY)
Admission: RE | Admit: 2016-03-01 | Discharge: 2016-03-01 | Disposition: A | Discharge status: Home / Self Care | Payer: Managed Care, Other (non HMO) | Source: Ambulatory Visit | Attending: Family Medicine | Admitting: Family Medicine

## 2016-03-01 DIAGNOSIS — Z1231 Encounter for screening mammogram for malignant neoplasm of breast: Secondary | ICD-10-CM | POA: Insufficient documentation

## 2016-07-05 ENCOUNTER — Ambulatory Visit (INDEPENDENT_AMBULATORY_CARE_PROVIDER_SITE_OTHER): Payer: Managed Care, Other (non HMO) | Admitting: Adult Health

## 2016-07-05 ENCOUNTER — Encounter: Payer: Self-pay | Admitting: Adult Health

## 2016-07-05 VITALS — BP 140/90 | HR 72 | Ht 64.5 in | Wt 239.5 lb

## 2016-07-05 DIAGNOSIS — Z3041 Encounter for surveillance of contraceptive pills: Secondary | ICD-10-CM

## 2016-07-05 DIAGNOSIS — Z01411 Encounter for gynecological examination (general) (routine) with abnormal findings: Secondary | ICD-10-CM

## 2016-07-05 DIAGNOSIS — Z01419 Encounter for gynecological examination (general) (routine) without abnormal findings: Secondary | ICD-10-CM

## 2016-07-05 DIAGNOSIS — R232 Flushing: Secondary | ICD-10-CM

## 2016-07-05 DIAGNOSIS — Z1212 Encounter for screening for malignant neoplasm of rectum: Secondary | ICD-10-CM | POA: Diagnosis not present

## 2016-07-05 DIAGNOSIS — D259 Leiomyoma of uterus, unspecified: Secondary | ICD-10-CM | POA: Diagnosis not present

## 2016-07-05 DIAGNOSIS — R030 Elevated blood-pressure reading, without diagnosis of hypertension: Secondary | ICD-10-CM | POA: Diagnosis not present

## 2016-07-05 DIAGNOSIS — J0100 Acute maxillary sinusitis, unspecified: Secondary | ICD-10-CM

## 2016-07-05 LAB — HEMOCCULT GUIAC POC 1CARD (OFFICE): Fecal Occult Blood, POC: NEGATIVE

## 2016-07-05 MED ORDER — NORETHIN-ETH ESTRAD TRIPHASIC 0.5/0.75/1-35 MG-MCG PO TABS: ORAL_TABLET | ORAL | 12 refills | Status: DC

## 2016-07-05 MED ORDER — AZITHROMYCIN 250 MG PO TABS: ORAL_TABLET | ORAL | 0 refills | Status: DC

## 2016-07-05 NOTE — Patient Instructions (Signed)
Physical in 1 year Pap in 2019 Mammogram yearly Recheck BP in 4 weeks Decrease salt and sugar

## 2016-07-05 NOTE — Progress Notes (Signed)
Patient ID: Tracy Marks, female   DOB: 1975/08/30, 40 y.o.   MRN: [id number] History of Present Illness: Tracy Marks is a 40 year old black female in for well woman gyn exam, she had a normal pap with negative HPV 07/01/15. PCP is [place].    Current Medications, Allergies, Past Medical History, Past Surgical History, Family History and Social History were reviewed in [employer] record.     Review of Systems: Patient denies any headaches, hearing loss, fatigue, blurred vision, shortness of breath, chest pain, abdominal pain, problems with bowel movements, urination, or intercourse. No joint pain or mood swings.some hot flashes, has congestion and nasal drip and face and ears hurt some    Physical Exam:BP 140/90 (BP Location: Right Arm, Patient Position: Sitting, Cuff Size: Large)   Pulse 72   Ht 5' 4.5" (1.638 m)   Wt 239 lb 8 oz (108.6 kg)   LMP 06/12/2016 (Approximate)   BMI 40.48 kg/m  General:  Well developed, well nourished, no acute distress Skin:  Warm and dry, ears clear, has tenderness maxillary sinus area and some at ethmoid, throat normal in appearance  Neck:  Midline trachea, normal thyroid, good ROM, no lymphadenopathy Lungs; Clear to auscultation bilaterally Breast:  No dominant palpable mass, retraction, or nipple discharge Cardiovascular: Regular rate and rhythm Abdomen:  Soft, non tender, no hepatosplenomegaly Pelvic:  External genitalia is normal in appearance, no lesions.  The vagina is normal in appearance. Urethra has no lesions or masses. The cervix is smooth.  Uterus is felt to be enlarged, has known fibroids.  No adnexal masses or tenderness noted.Bladder is non tender, no masses felt. Rectal: Good sphincter tone, no polyps, or hemorrhoids felt.  Hemoccult negative. Extremities/musculoskeletal:  No swelling or varicosities noted, no clubbing or cyanosis Psych:  No mood changes, alert and cooperative,seems happy PHQ 2 score 0.  Impression: 1.  Well woman exam with routine gynecological exam   2. Encounter for surveillance of contraceptive pills   3. Uterine leiomyoma, unspecified location   4. Hot flashes   5. Subacute maxillary sinusitis   6. Elevated BP without diagnosis of hypertension       Plan:  Meds ordered this encounter  Medications  . norethindrone-ethinyl estradiol (DASETTA 7/7/7) 0.5/0.75/1-35 MG-MCG tablet    Sig: TAKE ONE (1) TABLET EACH DAY    Dispense:  28 tablet    Refill:  12    Order Specific Question:   Supervising Provider    Answer:   Elonda Husky, LUTHER H [[id number]]  . azithromycin (ZITHROMAX) 250 MG tablet    Sig: Take 2 now and then 1 daily    Dispense:  6 tablet    Refill:  0    Order Specific Question:   Supervising Provider    Answer:   Florian Buff [[id number]]  Check CBC,CMP,TSH and lipids,A1c and vitamin D Physical in 1 year Pap in 2019 Mammogram yearly Recheck BP in 4 weeks Decrease salt and sugar

## 2016-07-06 LAB — TSH: TSH: 4.19 u[IU]/mL (ref 0.450–4.500)

## 2016-07-06 LAB — VITAMIN D 25 HYDROXY (VIT D DEFICIENCY, FRACTURES): Vit D, 25-Hydroxy: 42 ng/mL (ref 30.0–100.0)

## 2016-07-06 LAB — COMPREHENSIVE METABOLIC PANEL
A/G RATIO: 1.3 (ref 1.2–2.2)
ALBUMIN: 3.9 g/dL (ref 3.5–5.5)
ALT: 15 IU/L (ref 0–32)
AST: 19 IU/L (ref 0–40)
Alkaline Phosphatase: 118 IU/L — ABNORMAL HIGH (ref 39–117)
BILIRUBIN TOTAL: 0.3 mg/dL (ref 0.0–1.2)
BUN / CREAT RATIO: 15 (ref 9–23)
BUN: 9 mg/dL (ref 6–24)
CALCIUM: 8.9 mg/dL (ref 8.7–10.2)
CHLORIDE: 101 mmol/L (ref 96–106)
CO2: 23 mmol/L (ref 18–29)
Creatinine, Ser: 0.62 mg/dL (ref 0.57–1.00)
GFR, EST AFRICAN AMERICAN: 129 mL/min/{1.73_m2} (ref >59)
GFR, EST NON AFRICAN AMERICAN: 112 mL/min/{1.73_m2} (ref >59)
Globulin, Total: 3.1 g/dL (ref 1.5–4.5)
Glucose: 78 mg/dL (ref 65–99)
POTASSIUM: 4.5 mmol/L (ref 3.5–5.2)
Sodium: 136 mmol/L (ref 134–144)
TOTAL PROTEIN: 7 g/dL (ref 6.0–8.5)

## 2016-07-06 LAB — HEMOGLOBIN A1C
Est. average glucose Bld gHb Est-mCnc: 108 mg/dL
HEMOGLOBIN A1C: 5.4 % (ref 4.8–5.6)

## 2016-07-06 LAB — LIPID PANEL
CHOL/HDL RATIO: 2.5 ratio (ref 0.0–4.4)
Cholesterol, Total: 190 mg/dL (ref 100–199)
HDL: 77 mg/dL (ref >39)
LDL CALC: 97 mg/dL (ref 0–99)
Triglycerides: 80 mg/dL (ref 0–149)
VLDL Cholesterol Cal: 16 mg/dL (ref 5–40)

## 2016-07-06 LAB — CBC
HEMOGLOBIN: 11.4 g/dL (ref 11.1–15.9)
Hematocrit: 34.1 % (ref 34.0–46.6)
MCH: 32.2 pg (ref 26.6–33.0)
MCHC: 33.4 g/dL (ref 31.5–35.7)
MCV: 96 fL (ref 79–97)
Platelets: 435 10*3/uL — ABNORMAL HIGH (ref 150–379)
RBC: 3.54 x10E6/uL — AB (ref 3.77–5.28)
RDW: 13.2 % (ref 12.3–15.4)
WBC: 7.5 10*3/uL (ref 3.4–10.8)

## 2016-08-06 ENCOUNTER — Encounter: Payer: Self-pay | Admitting: Adult Health

## 2016-08-06 ENCOUNTER — Ambulatory Visit (INDEPENDENT_AMBULATORY_CARE_PROVIDER_SITE_OTHER): Payer: Managed Care, Other (non HMO) | Admitting: Adult Health

## 2016-08-06 VITALS — BP 128/80 | HR 84 | Ht 63.0 in | Wt 237.5 lb

## 2016-08-06 DIAGNOSIS — Z9289 Personal history of other medical treatment: Secondary | ICD-10-CM

## 2016-08-06 DIAGNOSIS — Z0130 Encounter for examination of blood pressure without abnormal findings: Secondary | ICD-10-CM

## 2016-08-06 NOTE — Patient Instructions (Signed)
keep check on BP

## 2016-08-06 NOTE — Progress Notes (Signed)
Subjective:     Patient ID: Tracy Marks, female   DOB: 02/20/1976, 41 y.o.   MRN: [id number]  HPI Gerarda is a 41 year old black female treated for sinus infection in December and BP was elevated for recheck.Feels better, no complaints.   Review of Systems Patient denies any headaches, felt better after Z pack  Reviewed past medical,surgical, social and family history. Reviewed medications and allergies.     Objective:   Physical Exam BP 128/80 (BP Location: Left Arm, Patient Position: Sitting, Cuff Size: Large)   Pulse 84   Ht 5\' 3"  (1.6 m)   Wt 237 lb 8 oz (107.7 kg)   LMP 07/08/2016 (Approximate)   BMI 42.07 kg/m PHQ 2 score 0. Skin warm and dry. Lungs: clear to ausculation bilaterally. Cardiovascular: regular rate and rhythm.    Assessment:   BP monitoring    Plan:     Keep check on BP, once a week for a while Follow up prn

## 2016-08-22 ENCOUNTER — Telehealth: Payer: Self-pay | Admitting: Adult Health

## 2016-08-22 NOTE — Telephone Encounter (Signed)
Had BTB, keep taking OCs, will follow for now.

## 2016-08-23 ENCOUNTER — Encounter: Payer: Self-pay | Admitting: Adult Health

## 2016-10-05 ENCOUNTER — Encounter: Payer: Self-pay | Admitting: Adult Health

## 2016-10-05 ENCOUNTER — Telehealth: Payer: Managed Care, Other (non HMO) | Admitting: Family

## 2016-10-05 DIAGNOSIS — B9689 Other specified bacterial agents as the cause of diseases classified elsewhere: Secondary | ICD-10-CM

## 2016-10-05 DIAGNOSIS — J329 Chronic sinusitis, unspecified: Secondary | ICD-10-CM

## 2016-10-05 MED ORDER — AMOXICILLIN-POT CLAVULANATE 875-125 MG PO TABS
1.0000 | ORAL_TABLET | Freq: Two times a day (BID) | ORAL | 0 refills | Status: AC
Start: 2016-10-05 — End: 2016-10-12

## 2016-10-05 NOTE — Progress Notes (Signed)

## 2017-01-01 ENCOUNTER — Other Ambulatory Visit: Payer: Self-pay | Admitting: Adult Health

## 2017-01-01 DIAGNOSIS — Z1231 Encounter for screening mammogram for malignant neoplasm of breast: Secondary | ICD-10-CM

## 2017-03-04 ENCOUNTER — Ambulatory Visit (HOSPITAL_COMMUNITY)
Admission: RE | Admit: 2017-03-04 | Discharge: 2017-03-04 | Disposition: A | Discharge status: Home / Self Care | Payer: 59 | Source: Ambulatory Visit | Attending: Adult Health | Admitting: Adult Health

## 2017-03-04 DIAGNOSIS — Z1231 Encounter for screening mammogram for malignant neoplasm of breast: Secondary | ICD-10-CM | POA: Diagnosis present

## 2017-06-26 ENCOUNTER — Other Ambulatory Visit: Payer: Self-pay | Admitting: *Deleted

## 2017-06-26 MED ORDER — NORETHIN-ETH ESTRAD TRIPHASIC 0.5/0.75/1-35 MG-MCG PO TABS: ORAL_TABLET | ORAL | 3 refills | Status: DC

## 2017-07-08 ENCOUNTER — Other Ambulatory Visit: Payer: Managed Care, Other (non HMO) | Admitting: Adult Health

## 2017-07-16 ENCOUNTER — Other Ambulatory Visit: Payer: Managed Care, Other (non HMO) | Admitting: Adult Health

## 2017-07-19 ENCOUNTER — Ambulatory Visit (INDEPENDENT_AMBULATORY_CARE_PROVIDER_SITE_OTHER): Payer: 59 | Admitting: Adult Health

## 2017-07-19 ENCOUNTER — Encounter: Payer: Self-pay | Admitting: Adult Health

## 2017-07-19 VITALS — BP 150/84 | HR 87 | Ht 64.0 in | Wt 240.5 lb

## 2017-07-19 DIAGNOSIS — Z1212 Encounter for screening for malignant neoplasm of rectum: Secondary | ICD-10-CM

## 2017-07-19 DIAGNOSIS — Z01411 Encounter for gynecological examination (general) (routine) with abnormal findings: Secondary | ICD-10-CM | POA: Diagnosis not present

## 2017-07-19 DIAGNOSIS — Z1211 Encounter for screening for malignant neoplasm of colon: Secondary | ICD-10-CM | POA: Diagnosis not present

## 2017-07-19 DIAGNOSIS — R030 Elevated blood-pressure reading, without diagnosis of hypertension: Secondary | ICD-10-CM | POA: Diagnosis not present

## 2017-07-19 DIAGNOSIS — Z3041 Encounter for surveillance of contraceptive pills: Secondary | ICD-10-CM

## 2017-07-19 DIAGNOSIS — D259 Leiomyoma of uterus, unspecified: Secondary | ICD-10-CM

## 2017-07-19 DIAGNOSIS — Z01419 Encounter for gynecological examination (general) (routine) without abnormal findings: Secondary | ICD-10-CM | POA: Insufficient documentation

## 2017-07-19 LAB — HEMOCCULT GUIAC POC 1CARD (OFFICE): FECAL OCCULT BLD: NEGATIVE

## 2017-07-19 MED ORDER — NORETHIN-ETH ESTRAD TRIPHASIC 0.5/0.75/1-35 MG-MCG PO TABS: ORAL_TABLET | ORAL | 3 refills | Status: DC

## 2017-07-19 NOTE — Progress Notes (Signed)
Patient ID: Tracy Marks, female   DOB: [birth year], 41 y.o.   MRN: [id number] History of Present Illness:  Tracy Marks is a 41 year old black female,single,G1P1,in for well woman gyn exam,she had normal pap with negative HPV 07/01/15.She had labs at work.  PCP is [place].  Current Medications, Allergies, Past Medical History, Past Surgical History, Family History and Social History were reviewed in [employer] record.     Review of Systems:  Patient denies any headaches, hearing loss, fatigue, blurred vision, shortness of breath, chest pain, abdominal pain, problems with bowel movements, urination, or intercourse. No joint pain or mood swings.   Physical Exam:BP (!) 150/84 (BP Location: Left Arm, Patient Position: Sitting, Cuff Size: Large)   Pulse 87   Ht 5\' 4"  (1.626 m)   Wt 240 lb 8 oz (109.1 kg)   LMP 07/10/2017 (Approximate)   BMI 41.28 kg/m   General:  Well developed, well nourished, no acute distress Skin:  Warm and dry Neck:  Midline trachea, normal thyroid, good ROM, no lymphadenopathy Lungs; Clear to auscultation bilaterally Breast:  No dominant palpable mass, retraction, or nipple discharge Cardiovascular: Regular rate and rhythm Abdomen:  Soft, non tender, no hepatosplenomegaly Pelvic:  External genitalia is normal in appearance, no lesions.  The vagina is normal in appearance. Urethra has no lesions or masses. The cervix is bulbous.  Uterus is felt to be normal size, shape, and contour.  No adnexal masses or tenderness noted.Bladder is non tender, no masses felt. Rectal: Good sphincter tone, no polyps, or hemorrhoids felt.  Hemoccult negative. Extremities/musculoskeletal:  No swelling or varicosities noted, no clubbing or cyanosis Psych:  No mood changes, alert and cooperative,seems happy PHQ 2 score 0. BP has been normal at home.  Impression: 1. Well woman exam with routine gynecological exam   2. Encounter for surveillance of contraceptive pills    3. Screening for colorectal cancer   4. Uterine leiomyoma, unspecified location   5. Elevated BP without diagnosis of hypertension       Plan: Pap and physical in 1 year Mammogram yearly Check BP at home Meds ordered this encounter  Medications  . norethindrone-ethinyl estradiol (DASETTA 7/7/7) 0.5/0.75/1-35 MG-MCG tablet    Sig: TAKE ONE (1) TABLET EACH DAY    Dispense:  84 tablet    Refill:  3    Order Specific Question:   Supervising Provider    Answer:   Tania Ade H [[id number]]

## 2017-07-19 NOTE — Patient Instructions (Signed)
Check BP at home

## 2017-11-26 ENCOUNTER — Telehealth: Payer: 59 | Admitting: Family

## 2017-11-26 DIAGNOSIS — B9689 Other specified bacterial agents as the cause of diseases classified elsewhere: Secondary | ICD-10-CM

## 2017-11-26 DIAGNOSIS — J019 Acute sinusitis, unspecified: Secondary | ICD-10-CM

## 2017-11-26 MED ORDER — AMOXICILLIN-POT CLAVULANATE 875-125 MG PO TABS: 1.0000 | ORAL_TABLET | Freq: Two times a day (BID) | ORAL | 0 refills | Status: DC

## 2017-11-26 NOTE — Progress Notes (Signed)

## 2018-01-17 ENCOUNTER — Other Ambulatory Visit: Payer: Self-pay | Admitting: Adult Health

## 2018-01-17 DIAGNOSIS — Z1231 Encounter for screening mammogram for malignant neoplasm of breast: Secondary | ICD-10-CM

## 2018-03-05 ENCOUNTER — Ambulatory Visit (HOSPITAL_COMMUNITY)
Admission: RE | Admit: 2018-03-05 | Discharge: 2018-03-05 | Disposition: A | Discharge status: Home / Self Care | Payer: 59 | Source: Ambulatory Visit | Attending: Adult Health | Admitting: Adult Health

## 2018-03-05 DIAGNOSIS — Z1231 Encounter for screening mammogram for malignant neoplasm of breast: Secondary | ICD-10-CM

## 2018-04-18 ENCOUNTER — Ambulatory Visit (HOSPITAL_COMMUNITY)
Admission: EM | Admit: 2018-04-18 | Discharge: 2018-04-18 | Disposition: A | Discharge status: Home / Self Care | Payer: 59 | Attending: Family Medicine | Admitting: Family Medicine

## 2018-04-18 ENCOUNTER — Encounter (HOSPITAL_COMMUNITY): Payer: Self-pay | Admitting: Emergency Medicine

## 2018-04-18 ENCOUNTER — Other Ambulatory Visit: Payer: Self-pay

## 2018-04-18 DIAGNOSIS — H1132 Conjunctival hemorrhage, left eye: Secondary | ICD-10-CM

## 2018-04-18 DIAGNOSIS — J0100 Acute maxillary sinusitis, unspecified: Secondary | ICD-10-CM

## 2018-04-18 MED ORDER — FLUCONAZOLE 150 MG PO TABS: 150.0000 mg | ORAL_TABLET | Freq: Once | ORAL | 0 refills | Status: AC

## 2018-04-18 MED ORDER — PREDNISONE 20 MG PO TABS: ORAL_TABLET | ORAL | 0 refills | Status: DC

## 2018-04-18 MED ORDER — AMOXICILLIN 875 MG PO TABS: 875.0000 mg | ORAL_TABLET | Freq: Two times a day (BID) | ORAL | 0 refills | Status: DC

## 2018-04-18 NOTE — ED Triage Notes (Signed)
Pt reports sinus pressure and pain on her left side with ear pain, and a red spot on her eye.  She also reports body aches, feeling very tight.

## 2018-04-18 NOTE — ED Provider Notes (Signed)
[place]    CSN: [id number] Arrival date & time: 04/18/18  1717     History   Chief Complaint Chief Complaint  Patient presents with  . URI    HPI Tracy Marks is a 42 y.o. female.   Pt reports sinus pressure and pain on her left side with ear pain, and a red spot on her eye.  She also reports body aches, feeling very tight.  Symptoms began about 2 weeks ago.  She has had the same syndrome before and found that prednisone helped her.  Currently she is taking Nasonex and this is not controlling the symptoms.  She has had no fever.  She has no history of asthma.  Patient also has a red spot on her left eye.  It started yesterday and is getting better.  There is no pain involved.  There was no trauma prior to this.  Patient works for an [employer] at her own house.     Past Medical History:  Diagnosis Date  . Contraceptive management 06/17/2013  . Fibroids 08/26/2015  . Heavy periods 07/01/2015  . Hot flashes 07/01/2015  . Obesity   . Sinusitis    and seasonal allergies    Patient Active Problem List   Diagnosis Date Noted  . Well woman exam with routine gynecological exam 07/19/2017  . Elevated BP without diagnosis of hypertension 07/19/2017  . Uterine leiomyoma 08/26/2015  . Heavy periods 07/01/2015  . Hot flashes 07/01/2015  . Peri-menopause 07/01/2015  . Positive fecal occult blood test 06/22/2014  . Contraceptive management 06/17/2013    Past Surgical History:  Procedure Laterality Date  . CESAREAN SECTION  10/25/1994  . CHOLECYSTECTOMY  12/2005    OB History    Gravida  1   Para  1   Term  1   Preterm      AB      Living  1     SAB      TAB      Ectopic      Multiple      Live Births  1            Home Medications    Prior to Admission medications   Medication Sig Start Date End Date Taking? Authorizing Provider  fexofenadine (ALLEGRA) 180 MG tablet Take 180 mg by mouth daily.   Yes [provider]  mometasone (NASONEX) 50 MCG/ACT nasal spray Place 1 spray into the nose as needed.   Yes [provider]  Multiple Vitamins-Calcium (ONE-A-DAY WOMENS PO) Take 1 tablet by mouth daily.   Yes [provider]  norethindrone-ethinyl estradiol (DASETTA 7/7/7) 0.5/0.75/1-35 MG-MCG tablet TAKE ONE (1) TABLET EACH DAY 07/19/17  Yes Derrek Monaco A, NP  amoxicillin (AMOXIL) 875 MG tablet Take 1 tablet (875 mg total) by mouth 2 (two) times daily. 04/18/18   Robyn Haber, MD  fluconazole (DIFLUCAN) 150 MG tablet Take 1 tablet (150 mg total) by mouth once for 1 dose. Repeat if needed 04/18/18 04/18/18  Robyn Haber, MD  predniSONE (DELTASONE) 20 MG tablet one daily with food 04/18/18   Robyn Haber, MD    Family History Family History  Problem Relation Age of Onset  . Heart disease Mother   . Hypertension Mother   . Diabetes Mother   . Hyperlipidemia Father   . Hypertension Maternal Grandmother   . Arthritis Maternal Grandmother   . Ulcers Maternal Grandfather     Social History Social History  Tobacco Use  . Smoking status: Never Smoker  . Smokeless tobacco: Never Used  Substance Use Topics  . Alcohol use: No  . Drug use: No     Allergies   Patient has no known allergies.   Review of Systems Review of Systems  Constitutional: Negative.   HENT: Positive for ear pain, sinus pressure and sore throat.   Respiratory: Negative.      Physical Exam Triage Vital Signs ED Triage Vitals  Enc Vitals Group     BP 04/18/18 1736 (!) 157/80     Pulse Rate 04/18/18 1736 85     Resp --      Temp 04/18/18 1736 98.1 F (36.7 C)     Temp Source 04/18/18 1736 Oral     SpO2 04/18/18 1736 100 %     Weight --      Height --      Head Circumference --      Peak Flow --      Pain Score 04/18/18 1737 9     Pain Loc --      Pain Edu? --      Excl. in [place]? --    No data found.  Updated Vital Signs BP (!) 157/80 (BP Location: Left Arm)   Pulse 85   Temp  98.1 F (36.7 C) (Oral)   LMP 04/15/2018 (Exact Date)   SpO2 100%    Physical Exam  Constitutional: She is oriented to person, place, and time. She appears well-developed and well-nourished.  HENT:  Head: Normocephalic.  Right Ear: External ear normal.  Left Ear: External ear normal.  Mouth/Throat: Oropharynx is clear and moist.  Eyes: Pupils are equal, round, and reactive to light. EOM are normal.  Left sub-conjunctival hemorrhage on the lateral aspect  Neck: Normal range of motion. Neck supple.  Cardiovascular: Normal rate.  Pulmonary/Chest: Effort normal and breath sounds normal.  Musculoskeletal: Normal range of motion.  Neurological: She is alert and oriented to person, place, and time.  Skin: Skin is warm and dry.  Nursing note and vitals reviewed.    UC Treatments / Results  Labs (all labs ordered are listed, but only abnormal results are displayed) Labs Reviewed - No data to display  EKG None  Radiology No results found.  Procedures Procedures (including critical care time)  Medications Ordered in UC Medications - No data to display  Initial Impression / Assessment and Plan / UC Course  I have reviewed the triage vital signs and the nursing notes.  Pertinent labs & imaging results that were available during my care of the patient were reviewed by me and considered in my medical decision making (see chart for details).    Final Clinical Impressions(s) / UC Diagnoses   Final diagnoses:  Acute non-recurrent maxillary sinusitis  Subconjunctival hemorrhage, left   Discharge Instructions   None    ED Prescriptions    Medication Sig Dispense Auth. Provider   amoxicillin (AMOXIL) 875 MG tablet Take 1 tablet (875 mg total) by mouth 2 (two) times daily. 20 tablet Robyn Haber, MD   predniSONE (DELTASONE) 20 MG tablet one daily with food 5 tablet Robyn Haber, MD   fluconazole (DIFLUCAN) 150 MG tablet Take 1 tablet (150 mg total) by mouth once for 1  dose. Repeat if needed 2 tablet Robyn Haber, MD     Controlled Substance Prescriptions [place] Controlled Substance Registry consulted? Not Applicable   Robyn Haber, MD 04/18/18 1756

## 2018-07-28 ENCOUNTER — Other Ambulatory Visit (HOSPITAL_COMMUNITY)
Admission: RE | Admit: 2018-07-28 | Discharge: 2018-07-28 | Disposition: A | Discharge status: Home / Self Care | Payer: 59 | Source: Ambulatory Visit | Attending: Adult Health | Admitting: Adult Health

## 2018-07-28 ENCOUNTER — Ambulatory Visit (INDEPENDENT_AMBULATORY_CARE_PROVIDER_SITE_OTHER): Payer: 59 | Admitting: Adult Health

## 2018-07-28 ENCOUNTER — Other Ambulatory Visit: Payer: Self-pay

## 2018-07-28 ENCOUNTER — Encounter: Payer: Self-pay | Admitting: Adult Health

## 2018-07-28 VITALS — BP 148/84 | HR 93 | Resp 20 | Ht 65.0 in | Wt 242.0 lb

## 2018-07-28 DIAGNOSIS — Z3041 Encounter for surveillance of contraceptive pills: Secondary | ICD-10-CM

## 2018-07-28 DIAGNOSIS — Z1212 Encounter for screening for malignant neoplasm of rectum: Secondary | ICD-10-CM | POA: Diagnosis not present

## 2018-07-28 DIAGNOSIS — R030 Elevated blood-pressure reading, without diagnosis of hypertension: Secondary | ICD-10-CM

## 2018-07-28 DIAGNOSIS — Z1211 Encounter for screening for malignant neoplasm of colon: Secondary | ICD-10-CM | POA: Diagnosis not present

## 2018-07-28 DIAGNOSIS — Z01419 Encounter for gynecological examination (general) (routine) without abnormal findings: Secondary | ICD-10-CM | POA: Diagnosis present

## 2018-07-28 DIAGNOSIS — Z9289 Personal history of other medical treatment: Secondary | ICD-10-CM | POA: Insufficient documentation

## 2018-07-28 DIAGNOSIS — D219 Benign neoplasm of connective and other soft tissue, unspecified: Secondary | ICD-10-CM

## 2018-07-28 LAB — HEMOCCULT GUIAC POC 1CARD (OFFICE): Fecal Occult Blood, POC: NEGATIVE

## 2018-07-28 MED ORDER — NORETHIN-ETH ESTRAD TRIPHASIC 0.5/0.75/1-35 MG-MCG PO TABS: ORAL_TABLET | ORAL | 3 refills | Status: DC

## 2018-07-28 NOTE — Progress Notes (Signed)
Patient ID: Tracy Marks, female   DOB: Apr 25, 1976, 42 y.o.   MRN: [id number] History of Present Illness: Tracy Marks is a 42 year old black female,married, G1P1 in for well woman gyn exam and pap. PCP is [facility].    Current Medications, Allergies, Past Medical History, Past Surgical History, Family History and Social History were reviewed in [employer] record.     Review of Systems: Patient denies any headaches, hearing loss, fatigue, blurred vision, shortness of breath, chest pain, abdominal pain, problems with bowel movements, urination, or intercourse. No joint pain or mood swings. Periods heavy at times is on OCs and has know fibroids.  She checks BP at home and usually 120/70 ish.   Physical Exam:BP (!) 148/84 (BP Location: Right Arm, Patient Position: Sitting, Cuff Size: Normal)   Pulse 93   Resp 20   Ht 5\' 5"  (1.651 m)   Wt 242 lb (109.8 kg)   LMP 07/08/2018   BMI 40.27 kg/m  General:  Well developed, well nourished, no acute distress Skin:  Warm and dry Neck:  Midline trachea, normal thyroid, good ROM, no lymphadenopathy Lungs; Clear to auscultation bilaterally Breast:  No dominant palpable mass, retraction, or nipple discharge Cardiovascular: Regular rate and rhythm Abdomen:  Soft, non tender, no hepatosplenomegaly Pelvic:  External genitalia is normal in appearance, no lesions.  The vagina is normal in appearance. Urethra has no lesions or masses. The cervix is bulbous. Pap with HPV performed. Uterus is felt to be about 12 weeks in size, has known fibroids.  No adnexal masses or tenderness noted.Bladder is non tender, no masses felt. Rectal: Good sphincter tone, no polyps, or hemorrhoids felt.  Hemoccult negative. Extremities/musculoskeletal:  No swelling or varicosities noted, no clubbing or cyanosis Psych:  No mood changes, alert and cooperative,seems happy PHQ 2 score 0. Fall risk is low. Examination chaperoned by Shela Nevin RN. She  requests fasting labs,her work no longer offering them.   Impression: 1. Encounter for gynecological examination with Papanicolaou smear of cervix   2. Screening for colorectal cancer   3. Fibroids   4. Encounter for surveillance of contraceptive pills   5. Elevated BP without diagnosis of hypertension   6. History of ambulatory BP monitoring       Plan: Meds ordered this encounter  Medications  . norethindrone-ethinyl estradiol (DASETTA 7/7/7) 0.5/0.75/1-35 MG-MCG tablet    Sig: TAKE ONE (1) TABLET EACH DAY    Dispense:  84 tablet    Refill:  3    Order Specific Question:   Supervising Provider    Answer:   Tania Ade H [[id number]]  Check CBC,CMP,TSH and lipids Mammogram yearly Physical in 1 year Pap in 3 years if normal Keep check on BP at home

## 2018-07-29 LAB — COMPREHENSIVE METABOLIC PANEL
ALT: 15 IU/L (ref 0–32)
AST: 15 IU/L (ref 0–40)
Albumin/Globulin Ratio: 1.4 (ref 1.2–2.2)
Albumin: 4 g/dL (ref 3.5–5.5)
Alkaline Phosphatase: 121 IU/L — ABNORMAL HIGH (ref 39–117)
BUN/Creatinine Ratio: 12 (ref 9–23)
BUN: 9 mg/dL (ref 6–24)
Bilirubin Total: 0.3 mg/dL (ref 0.0–1.2)
CALCIUM: 9.3 mg/dL (ref 8.7–10.2)
CO2: 19 mmol/L — AB (ref 20–29)
CREATININE: 0.74 mg/dL (ref 0.57–1.00)
Chloride: 105 mmol/L (ref 96–106)
GFR calc Af Amer: 114 mL/min/{1.73_m2} (ref >59)
GFR, EST NON AFRICAN AMERICAN: 99 mL/min/{1.73_m2} (ref >59)
Globulin, Total: 2.9 g/dL (ref 1.5–4.5)
Glucose: 76 mg/dL (ref 65–99)
Potassium: 4.7 mmol/L (ref 3.5–5.2)
Sodium: 140 mmol/L (ref 134–144)
Total Protein: 6.9 g/dL (ref 6.0–8.5)

## 2018-07-29 LAB — TSH: TSH: 4.96 u[IU]/mL — ABNORMAL HIGH (ref 0.450–4.500)

## 2018-07-29 LAB — CYTOLOGY - PAP
DIAGNOSIS: NEGATIVE
HPV: NOT DETECTED

## 2018-07-29 LAB — LIPID PANEL
CHOLESTEROL TOTAL: 205 mg/dL — AB (ref 100–199)
Chol/HDL Ratio: 2.4 ratio (ref 0.0–4.4)
HDL: 85 mg/dL (ref >39)
LDL CALC: 102 mg/dL — AB (ref 0–99)
TRIGLYCERIDES: 88 mg/dL (ref 0–149)
VLDL CHOLESTEROL CAL: 18 mg/dL (ref 5–40)

## 2018-07-29 LAB — CBC
Hematocrit: 34.7 % (ref 34.0–46.6)
Hemoglobin: 11.2 g/dL (ref 11.1–15.9)
MCH: 31.2 pg (ref 26.6–33.0)
MCHC: 32.3 g/dL (ref 31.5–35.7)
MCV: 97 fL (ref 79–97)
PLATELETS: 397 10*3/uL (ref 150–450)
RBC: 3.59 x10E6/uL — ABNORMAL LOW (ref 3.77–5.28)
RDW: 12.9 % (ref 12.3–15.4)
WBC: 7.8 10*3/uL (ref 3.4–10.8)

## 2018-07-31 ENCOUNTER — Telehealth: Payer: Self-pay | Admitting: Adult Health

## 2018-07-31 NOTE — Telephone Encounter (Signed)
Left message to call me Monday about labs

## 2018-08-04 ENCOUNTER — Telehealth: Payer: Self-pay | Admitting: Adult Health

## 2018-08-04 NOTE — Telephone Encounter (Signed)
Pt aware of labs and pap, recheck TSH in 1 month

## 2018-08-26 ENCOUNTER — Telehealth: Payer: Self-pay | Admitting: *Deleted

## 2018-08-26 DIAGNOSIS — R7989 Other specified abnormal findings of blood chemistry: Secondary | ICD-10-CM

## 2018-08-26 NOTE — Telephone Encounter (Signed)
LMOVM that order for TSH was put in and she could come Monday or Tuesday to get that drawn.

## 2018-09-02 ENCOUNTER — Other Ambulatory Visit: Payer: 59

## 2018-09-02 DIAGNOSIS — R7989 Other specified abnormal findings of blood chemistry: Secondary | ICD-10-CM

## 2018-09-03 LAB — TSH: TSH: 3.73 u[IU]/mL (ref 0.450–4.500)

## 2019-02-09 ENCOUNTER — Other Ambulatory Visit (HOSPITAL_COMMUNITY): Payer: Self-pay | Admitting: Adult Health

## 2019-02-09 DIAGNOSIS — Z1231 Encounter for screening mammogram for malignant neoplasm of breast: Secondary | ICD-10-CM

## 2019-03-09 ENCOUNTER — Other Ambulatory Visit: Payer: Self-pay

## 2019-03-09 ENCOUNTER — Ambulatory Visit (HOSPITAL_COMMUNITY)
Admission: RE | Admit: 2019-03-09 | Discharge: 2019-03-09 | Disposition: A | Discharge status: Home / Self Care | Payer: 59 | Source: Ambulatory Visit | Attending: Adult Health | Admitting: Adult Health

## 2019-03-09 DIAGNOSIS — Z1231 Encounter for screening mammogram for malignant neoplasm of breast: Secondary | ICD-10-CM | POA: Insufficient documentation

## 2019-04-28 ENCOUNTER — Telehealth: Payer: Self-pay | Admitting: Adult Health

## 2019-04-28 NOTE — Telephone Encounter (Signed)
Pt says Period went off Sunday then started back on Thursday, then was lighter then heavier, nothing today so far. She had a hot flash Friday, discussed this could be menopause, and has night sweats, too.  Will follow for now, but call if gets worse

## 2019-05-20 ENCOUNTER — Other Ambulatory Visit: Payer: Self-pay | Admitting: Adult Health

## 2019-05-20 MED ORDER — TAYTULLA 1-20 MG-MCG(24) PO CAPS: 1.0000 | ORAL_CAPSULE | Freq: Every day | ORAL | 3 refills | Status: DC

## 2019-05-20 NOTE — Progress Notes (Signed)
rx taytulla

## 2019-07-10 ENCOUNTER — Other Ambulatory Visit: Payer: Self-pay | Admitting: Adult Health

## 2019-07-14 ENCOUNTER — Other Ambulatory Visit: Payer: Self-pay | Admitting: Adult Health

## 2019-09-15 ENCOUNTER — Ambulatory Visit (INDEPENDENT_AMBULATORY_CARE_PROVIDER_SITE_OTHER): Payer: 59 | Admitting: Adult Health

## 2019-09-15 ENCOUNTER — Encounter: Payer: Self-pay | Admitting: Adult Health

## 2019-09-15 ENCOUNTER — Other Ambulatory Visit: Payer: Self-pay

## 2019-09-15 VITALS — BP 160/94 | HR 90 | Ht 64.0 in | Wt 235.6 lb

## 2019-09-15 DIAGNOSIS — Z3041 Encounter for surveillance of contraceptive pills: Secondary | ICD-10-CM | POA: Diagnosis not present

## 2019-09-15 DIAGNOSIS — N926 Irregular menstruation, unspecified: Secondary | ICD-10-CM

## 2019-09-15 DIAGNOSIS — R030 Elevated blood-pressure reading, without diagnosis of hypertension: Secondary | ICD-10-CM

## 2019-09-15 DIAGNOSIS — Z1211 Encounter for screening for malignant neoplasm of colon: Secondary | ICD-10-CM | POA: Diagnosis not present

## 2019-09-15 DIAGNOSIS — Z01419 Encounter for gynecological examination (general) (routine) without abnormal findings: Secondary | ICD-10-CM

## 2019-09-15 DIAGNOSIS — Z1212 Encounter for screening for malignant neoplasm of rectum: Secondary | ICD-10-CM | POA: Diagnosis not present

## 2019-09-15 LAB — HEMOCCULT GUIAC POC 1CARD (OFFICE): Fecal Occult Blood, POC: NEGATIVE

## 2019-09-15 NOTE — Progress Notes (Signed)
Patient ID: Tracy Marks, female   DOB: 15-Jan-1976, 44 y.o.   MRN: [id number] History of Present Illness:  Tracy Marks is a 44 year old black female, married, G1P1 in for a well woman gyn exam, she had a normal pap with negative HPV 07/18/18. PCP is [place].  Current Medications, Allergies, Past Medical History, Past Surgical History, Family History and Social History were reviewed in [employer] record.     Review of Systems: Patient denies any headaches, hearing loss, fatigue, blurred vision, shortness of breath, chest pain, abdominal pain, problems with bowel movements, urination, or intercourse. No joint pain. Periods are irregular on OCs Having some hot flashes and mood swings    Physical Exam:BP (!) 160/94 (BP Location: Left Arm, Cuff Size: Normal)   Pulse 90   Ht 5\' 4"  (1.626 m)   Wt 235 lb 9.6 oz (106.9 kg)   LMP 09/12/2019 (Exact Date)   BMI 40.44 kg/m  General:  Well developed, well nourished, no acute distress Skin:  Warm and dry Neck:  Midline trachea, normal thyroid, good ROM, no lymphadenopathy Lungs; Clear to auscultation bilaterally Breast:  No dominant palpable mass, retraction, or nipple discharge Cardiovascular: Regular rate and rhythm Abdomen:  Soft, non tender, no hepatosplenomegaly Pelvic:  External genitalia is normal in appearance, no lesions.  The vagina is normal in appearance,+period blood. Urethra has no lesions or masses. The cervix is bulbous.  Uterus is felt to be normal size, shape, and contour.  No adnexal masses or tenderness noted.Bladder is non tender, no masses felt. Rectal: Good sphincter tone, no polyps, or hemorrhoids felt.  Hemoccult negative. Extremities/musculoskeletal:  No swelling or varicosities noted, no clubbing or cyanosis Psych:  No mood changes, alert and cooperative,seems happy Fall risk is low PHQ 2 score is 0. Examination chaperoned by Terri Skains NP student, and co exam.  Impression and  Plan:  1. Encounter for well woman exam with routine gynecological exam Pap and physical in 1 year Mammogram yearly Check CBC,CMP,TSH and lipids  2. Screening for colorectal cancer Colonoscopy at 48  3. Encounter for surveillance of contraceptive pills finish current OCs and try taytulla, use condoms for 1 pack  4. Elevated BP without diagnosis of hypertension Keep check on BP at home,she has some white coat syndrome  5. Irregular bleeding Try Dois Davenport

## 2019-09-16 LAB — COMPREHENSIVE METABOLIC PANEL
ALT: 14 IU/L (ref 0–32)
AST: 15 IU/L (ref 0–40)
Albumin/Globulin Ratio: 1.3 (ref 1.2–2.2)
Albumin: 4.1 g/dL (ref 3.8–4.8)
Alkaline Phosphatase: 143 IU/L — ABNORMAL HIGH (ref 39–117)
BUN/Creatinine Ratio: 16 (ref 9–23)
BUN: 11 mg/dL (ref 6–24)
Bilirubin Total: 0.2 mg/dL (ref 0.0–1.2)
CO2: 21 mmol/L (ref 20–29)
Calcium: 9.1 mg/dL (ref 8.7–10.2)
Chloride: 103 mmol/L (ref 96–106)
Creatinine, Ser: 0.69 mg/dL (ref 0.57–1.00)
GFR calc Af Amer: 122 mL/min/{1.73_m2} (ref >59)
GFR calc non Af Amer: 105 mL/min/{1.73_m2} (ref >59)
Globulin, Total: 3.1 g/dL (ref 1.5–4.5)
Glucose: 81 mg/dL (ref 65–99)
Potassium: 4.6 mmol/L (ref 3.5–5.2)
Sodium: 138 mmol/L (ref 134–144)
Total Protein: 7.2 g/dL (ref 6.0–8.5)

## 2019-09-16 LAB — LIPID PANEL
Chol/HDL Ratio: 2.4 ratio (ref 0.0–4.4)
Cholesterol, Total: 204 mg/dL — ABNORMAL HIGH (ref 100–199)
HDL: 85 mg/dL (ref >39)
LDL Chol Calc (NIH): 105 mg/dL — ABNORMAL HIGH (ref 0–99)
Triglycerides: 81 mg/dL (ref 0–149)
VLDL Cholesterol Cal: 14 mg/dL (ref 5–40)

## 2019-09-16 LAB — CBC
Hematocrit: 31.2 % — ABNORMAL LOW (ref 34.0–46.6)
Hemoglobin: 10.7 g/dL — ABNORMAL LOW (ref 11.1–15.9)
MCH: 32.4 pg (ref 26.6–33.0)
MCHC: 34.3 g/dL (ref 31.5–35.7)
MCV: 95 fL (ref 79–97)
Platelets: 464 10*3/uL — ABNORMAL HIGH (ref 150–450)
RBC: 3.3 x10E6/uL — ABNORMAL LOW (ref 3.77–5.28)
RDW: 12.6 % (ref 11.7–15.4)
WBC: 7.3 10*3/uL (ref 3.4–10.8)

## 2019-09-16 LAB — TSH: TSH: 7.06 u[IU]/mL — ABNORMAL HIGH (ref 0.450–4.500)

## 2019-09-18 ENCOUNTER — Telehealth: Payer: Self-pay | Admitting: Adult Health

## 2019-09-18 ENCOUNTER — Other Ambulatory Visit: Payer: Self-pay | Admitting: Adult Health

## 2019-09-18 MED ORDER — LEVOTHYROXINE SODIUM 50 MCG PO TABS: 50.0000 ug | ORAL_TABLET | Freq: Every day | ORAL | 2 refills | Status: DC

## 2019-09-18 NOTE — Telephone Encounter (Signed)
Pt aware that TSH is elevated will rx synthroid and recheck in 8 weeks, take MV with iron

## 2019-11-09 ENCOUNTER — Other Ambulatory Visit: Payer: Self-pay | Admitting: Adult Health

## 2019-11-09 DIAGNOSIS — R7989 Other specified abnormal findings of blood chemistry: Secondary | ICD-10-CM

## 2019-11-09 NOTE — Progress Notes (Signed)
Check TSH and free T4 

## 2019-11-17 LAB — TSH: TSH: 2.58 u[IU]/mL (ref 0.450–4.500)

## 2019-11-17 LAB — T4, FREE: Free T4: 1.18 ng/dL (ref 0.82–1.77)

## 2020-01-29 ENCOUNTER — Other Ambulatory Visit (HOSPITAL_COMMUNITY): Payer: Self-pay | Admitting: Adult Health

## 2020-01-29 DIAGNOSIS — Z1231 Encounter for screening mammogram for malignant neoplasm of breast: Secondary | ICD-10-CM

## 2020-02-16 ENCOUNTER — Other Ambulatory Visit: Payer: Self-pay | Admitting: Adult Health

## 2020-03-14 ENCOUNTER — Other Ambulatory Visit: Payer: Self-pay

## 2020-03-14 ENCOUNTER — Ambulatory Visit (HOSPITAL_COMMUNITY)
Admission: RE | Admit: 2020-03-14 | Discharge: 2020-03-14 | Disposition: A | Discharge status: Home / Self Care | Payer: No Typology Code available for payment source | Source: Ambulatory Visit | Attending: Adult Health | Admitting: Adult Health

## 2020-03-14 DIAGNOSIS — Z1231 Encounter for screening mammogram for malignant neoplasm of breast: Secondary | ICD-10-CM

## 2020-05-07 ENCOUNTER — Other Ambulatory Visit: Payer: Self-pay | Admitting: Adult Health

## 2020-07-28 ENCOUNTER — Other Ambulatory Visit: Payer: Self-pay | Admitting: Adult Health

## 2020-08-04 ENCOUNTER — Other Ambulatory Visit: Payer: Self-pay | Admitting: Adult Health

## 2020-09-15 ENCOUNTER — Other Ambulatory Visit: Payer: Self-pay

## 2020-09-15 ENCOUNTER — Other Ambulatory Visit (HOSPITAL_COMMUNITY)
Admission: RE | Admit: 2020-09-15 | Discharge: 2020-09-15 | Disposition: A | Discharge status: Home / Self Care | Payer: No Typology Code available for payment source | Source: Ambulatory Visit | Attending: Adult Health | Admitting: Adult Health

## 2020-09-15 ENCOUNTER — Encounter: Payer: Self-pay | Admitting: Adult Health

## 2020-09-15 ENCOUNTER — Ambulatory Visit (INDEPENDENT_AMBULATORY_CARE_PROVIDER_SITE_OTHER): Payer: No Typology Code available for payment source | Admitting: Adult Health

## 2020-09-15 VITALS — BP 153/90 | Ht 64.25 in | Wt 236.6 lb

## 2020-09-15 DIAGNOSIS — Z1211 Encounter for screening for malignant neoplasm of colon: Secondary | ICD-10-CM | POA: Diagnosis not present

## 2020-09-15 DIAGNOSIS — D219 Benign neoplasm of connective and other soft tissue, unspecified: Secondary | ICD-10-CM

## 2020-09-15 DIAGNOSIS — E039 Hypothyroidism, unspecified: Secondary | ICD-10-CM

## 2020-09-15 DIAGNOSIS — Z3041 Encounter for surveillance of contraceptive pills: Secondary | ICD-10-CM

## 2020-09-15 DIAGNOSIS — E7800 Pure hypercholesterolemia, unspecified: Secondary | ICD-10-CM

## 2020-09-15 DIAGNOSIS — Z01419 Encounter for gynecological examination (general) (routine) without abnormal findings: Secondary | ICD-10-CM | POA: Diagnosis present

## 2020-09-15 DIAGNOSIS — R030 Elevated blood-pressure reading, without diagnosis of hypertension: Secondary | ICD-10-CM

## 2020-09-15 LAB — HEMOCCULT GUIAC POC 1CARD (OFFICE): Fecal Occult Blood, POC: NEGATIVE

## 2020-09-15 MED ORDER — NORETHIN ACE-ETH ESTRAD-FE 1-20 MG-MCG(24) PO CAPS: 1.0000 | ORAL_CAPSULE | Freq: Every day | ORAL | 4 refills | Status: DC

## 2020-09-15 NOTE — Progress Notes (Signed)
Patient ID: Tracy Marks, female   DOB: 23-Aug-1975, 45 y.o.   MRN: [id number] History of Present Illness: Tracy Marks is a 45 year old black female, married, G1P1 in for a well woman gyn exam and pap. She is request labs today PCP is [place].  Current Medications, Allergies, Past Medical History, Past Surgical History, Family History and Social History were reviewed in [employer] record.     Review of Systems: Patient denies any daily headaches, hearing loss, fatigue, blurred vision, shortness of breath, chest pain, abdominal pain, problems with bowel movements, urination, or intercourse. No joint pain or mood swings. Has sinus headache today due to weather changes. Periods shorter on OCs.    Physical Exam:BP (!) 153/90 (BP Location: Left Arm, Patient Position: Sitting, Cuff Size: Large)   Ht 5' 4.25" (1.632 m)   Wt 236 lb 9.6 oz (107.3 kg)   LMP 09/03/2020   BMI 40.30 kg/m  General:  Well developed, well nourished, no acute distress Skin:  Warm and dry Neck:  Midline trachea, normal thyroid, good ROM, no lymphadenopathy Lungs; Clear to auscultation bilaterally Breast:  No dominant palpable mass, retraction, or nipple discharge Cardiovascular: Regular rate and rhythm Abdomen:  Soft, non tender, no hepatosplenomegaly Pelvic:  External genitalia is normal in appearance, no lesions.  The vagina is normal in appearance. Urethra has no lesions or masses. The cervix is smooth, pap with HRHPV genotyping performed.  Uterus is felt to be about 18 week size, has fibroids.  No adnexal masses or tenderness noted.Bladder is non tender, no masses felt. Rectal: Good sphincter tone, no polyps, or hemorrhoids felt.  Hemoccult negative. Extremities/musculoskeletal:  No swelling or varicosities noted, no clubbing or cyanosis Psych:  No mood changes, alert and cooperative,seems happy AA is 0 Fall risk is low PHQ 9 score is 0 GAD 7 score is 0  [facility] - 09/15/20 0936       Pregnancy Intention Screening   Does the patient want to become pregnant in the next year? No    Does the patient's partner want to become pregnant in the next year? No    Would the patient like to discuss contraceptive options today? No      Contraception Wrap Up   Current Method Oral Contraceptive    End Method Oral Contraceptive    Contraception Counseling Provided No         Examination chaperoned by Tracy Pupa LPN  Impression and Plan: 1. Encounter for gynecological examination with Papanicolaou smear of cervix Pap sent Physical in 1 year Pap in 3 if normal Check CBC,CMP,TSH,free T4 and lipids Mammogram yearly, last was 03/14/20 and normal.  Colonoscopy advised, she is going to check with her insurance first and let me know   2. Encounter for surveillance of contraceptive pills Will continue OCs Meds ordered this encounter  Medications  . Norethin Ace-Eth Estrad-FE 1-20 MG-MCG(24) CAPS    Sig: Take 1 tablet by mouth daily.    Dispense:  84 capsule    Refill:  4    Order Specific Question:   Supervising Provider    Answer:   Elonda Husky, LUTHER H [[id number]]    3. Encounter for screening fecal occult blood testing   4. Fibroids Review handout on fibroids, and discussed with her that hers have grown could have removed if desired, has no pain  5. Hypothyroidism, unspecified type Check Free T4 and TSH Will wait on labs to refill synthroid, currently on 50 mcg daily   6.  Elevated cholesterol Check lipids  7. Elevated BP without diagnosis of hypertension Keep check on BP at home   8. Elevated blood pressure reading in office with white coat syndrome, without diagnosis of hypertension BPs good at home she says  Will follow up in 6 months

## 2020-09-15 NOTE — Patient Instructions (Signed)
Uterine Fibroids  Uterine fibroids, also called leiomyomas, are noncancerous (benign) tumors that can grow in the uterus. They can cause heavy menstrual bleeding and pain. Fibroids may also grow in the fallopian tubes, cervix, or tissues (ligaments) near the uterus. You may have one or many fibroids. Fibroids vary in size, weight, and where they grow in the uterus. Some can become quite large. Most fibroids do not require medical treatment. What are the causes? The cause of this condition is not known. What increases the risk? You are more likely to develop this condition if you:  Are in your 30s or 40s and have not gone through menopause.  Have a family history of this condition.  Are of African American descent.  Started your menstrual period at age 102 or younger.  Have never given birth.  Are overweight or obese. What are the signs or symptoms? Many women do not have any symptoms. Symptoms of this condition may include:  Heavy menstrual bleeding.  Bleeding between menstrual periods.  Pain and pressure in the pelvic area, between your hip bones.  Pain during sex.  Bladder problems, such as needing to urinate right away or more often than usual.  Inability to have children (infertility).  Failure to carry pregnancy to term (miscarriage). How is this diagnosed? This condition may be diagnosed based on:  Your symptoms and medical history.  A physical exam.  A pelvic exam that includes feeling for any tumors.  Imaging tests, such as ultrasound or MRI. How is this treated? Treatment for this condition may include follow-up visits with your health care provider to monitor your fibroids for any changes. Other treatment may include:  Medicines, such as: ? Medicines to relieve pain, including aspirin and NSAIDs, such as ibuprofen or naproxen. ? Hormone therapy. Treatment may be given as a pill or an injection, or it may be inserted into the uterus using an intrauterine  device (IUD).  Surgery that would do one of the following: ? Remove the fibroids (myomectomy). This may be recommended if fibroids affect your fertility and you want to become pregnant. ? Remove the uterus (hysterectomy). ? Block the blood supply to the fibroids (uterine artery embolization). This can cause them to shrink and die. Follow these instructions at home: Medicines  Take over-the-counter and prescription medicines only as told by your health care provider.  Ask your health care provider if you should take iron pills or eat more iron-rich foods, such as dark green, leafy vegetables. Heavy menstrual bleeding can cause low iron levels. Managing pain If directed, apply heat to your back or abdomen to reduce pain. Use the heat source that your health care provider recommends, such as a moist heat pack or a heating pad. To apply heat:  Place a towel between your skin and the heat source.  Leave the heat on for 20-30 minutes.  Remove the heat if your skin turns bright red. This is especially important if you are unable to feel pain, heat, or cold. You may have a greater risk of getting burned.   General instructions  Pay close attention to your menstrual cycle. Tell your health care provider about any changes, such as: ? Heavier bleeding that requires you to change your pads or tampons more than usual. ? A change in the number of days that your menstrual period lasts. ? A change in symptoms that come with your menstrual period, such as back pain or cramps in your abdomen.  Keep all follow-up visits. This is  important, especially if your fibroids need to be monitored for any changes. Contact a health care provider if you:  Have pelvic pain, back pain, or cramps in your abdomen that do not get better with medicine or heat.  Develop new bleeding between menstrual periods.  Have increased bleeding during or between menstrual periods.  Feel more tired or weak than usual.  Feel  light-headed. Get help right away if you:  Faint.  Have pelvic pain that suddenly gets worse.  Have severe vaginal bleeding that soaks a tampon or pad in 30 minutes or less. Summary  Uterine fibroids are noncancerous (benign) tumors that can develop in the uterus.  The exact cause of this condition is not known.  Most fibroids do not require medical treatment unless they affect your ability to have children (fertility).  Contact a health care provider if you have pelvic pain, back pain, or cramps in your abdomen that do not get better with medicines.  Get help right away if you faint, have pelvic pain that suddenly gets worse, or have severe vaginal bleeding. This information is not intended to replace advice given to you by your health care provider. Make sure you discuss any questions you have with your health care provider. Document Revised: 02/16/2020 Document Reviewed: 02/16/2020 Elsevier Patient Education  [place].

## 2020-09-16 LAB — LIPID PANEL
Chol/HDL Ratio: 2.9 ratio (ref 0.0–4.4)
Cholesterol, Total: 202 mg/dL — ABNORMAL HIGH (ref 100–199)
HDL: 70 mg/dL (ref >39)
LDL Chol Calc (NIH): 117 mg/dL — ABNORMAL HIGH (ref 0–99)
Triglycerides: 83 mg/dL (ref 0–149)
VLDL Cholesterol Cal: 15 mg/dL (ref 5–40)

## 2020-09-16 LAB — COMPREHENSIVE METABOLIC PANEL
ALT: 12 IU/L (ref 0–32)
AST: 21 IU/L (ref 0–40)
Albumin/Globulin Ratio: 1.3 (ref 1.2–2.2)
Albumin: 4 g/dL (ref 3.8–4.8)
Alkaline Phosphatase: 131 IU/L — ABNORMAL HIGH (ref 44–121)
BUN/Creatinine Ratio: 14 (ref 9–23)
BUN: 10 mg/dL (ref 6–24)
Bilirubin Total: 0.4 mg/dL (ref 0.0–1.2)
CO2: 19 mmol/L — ABNORMAL LOW (ref 20–29)
Calcium: 9.4 mg/dL (ref 8.7–10.2)
Chloride: 100 mmol/L (ref 96–106)
Creatinine, Ser: 0.72 mg/dL (ref 0.57–1.00)
GFR calc Af Amer: 116 mL/min/{1.73_m2} (ref >59)
GFR calc non Af Amer: 101 mL/min/{1.73_m2} (ref >59)
Globulin, Total: 3 g/dL (ref 1.5–4.5)
Glucose: 74 mg/dL (ref 65–99)
Potassium: 4.5 mmol/L (ref 3.5–5.2)
Sodium: 136 mmol/L (ref 134–144)
Total Protein: 7 g/dL (ref 6.0–8.5)

## 2020-09-16 LAB — CBC
Hematocrit: 34.4 % (ref 34.0–46.6)
Hemoglobin: 11.3 g/dL (ref 11.1–15.9)
MCH: 32.1 pg (ref 26.6–33.0)
MCHC: 32.8 g/dL (ref 31.5–35.7)
MCV: 98 fL — ABNORMAL HIGH (ref 79–97)
Platelets: 501 10*3/uL — ABNORMAL HIGH (ref 150–450)
RBC: 3.52 x10E6/uL — ABNORMAL LOW (ref 3.77–5.28)
RDW: 12 % (ref 11.7–15.4)
WBC: 6.1 10*3/uL (ref 3.4–10.8)

## 2020-09-16 LAB — TSH: TSH: 4.45 u[IU]/mL (ref 0.450–4.500)

## 2020-09-16 LAB — T4, FREE: Free T4: 1.21 ng/dL (ref 0.82–1.77)

## 2020-09-19 ENCOUNTER — Telehealth: Payer: Self-pay | Admitting: Adult Health

## 2020-09-19 LAB — CYTOLOGY - PAP
Comment: NEGATIVE
Diagnosis: NEGATIVE
High risk HPV: NEGATIVE

## 2020-09-20 ENCOUNTER — Encounter: Payer: Self-pay | Admitting: Adult Health

## 2020-09-20 ENCOUNTER — Telehealth: Payer: Self-pay | Admitting: Adult Health

## 2020-09-20 MED ORDER — LEVOTHYROXINE SODIUM 50 MCG PO TABS: ORAL_TABLET | ORAL | 3 refills | Status: DC

## 2020-09-20 NOTE — Telephone Encounter (Signed)
Pt aware of labs and pap, will refill synthroid

## 2020-10-04 ENCOUNTER — Encounter (INDEPENDENT_AMBULATORY_CARE_PROVIDER_SITE_OTHER): Payer: Self-pay | Admitting: *Deleted

## 2020-10-04 ENCOUNTER — Other Ambulatory Visit: Payer: Self-pay | Admitting: Adult Health

## 2020-10-04 DIAGNOSIS — Z1211 Encounter for screening for malignant neoplasm of colon: Secondary | ICD-10-CM

## 2020-10-04 NOTE — Progress Notes (Signed)
Refer to Dr Olevia Perches office for colonoscopy

## 2020-10-31 NOTE — Telephone Encounter (Signed)
Opened encounter in error  

## 2020-11-07 ENCOUNTER — Other Ambulatory Visit (INDEPENDENT_AMBULATORY_CARE_PROVIDER_SITE_OTHER): Payer: Self-pay | Admitting: *Deleted

## 2020-11-07 ENCOUNTER — Encounter (INDEPENDENT_AMBULATORY_CARE_PROVIDER_SITE_OTHER): Payer: Self-pay | Admitting: *Deleted

## 2020-11-07 ENCOUNTER — Telehealth (INDEPENDENT_AMBULATORY_CARE_PROVIDER_SITE_OTHER): Payer: Self-pay | Admitting: *Deleted

## 2020-11-07 MED ORDER — PEG 3350-KCL-NA BICARB-NACL 420 G PO SOLR: 4000.0000 mL | Freq: Once | ORAL | 0 refills | Status: AC

## 2020-11-07 NOTE — Telephone Encounter (Signed)
Patient needs trilyte 

## 2020-11-07 NOTE — Telephone Encounter (Signed)
Done

## 2020-11-10 ENCOUNTER — Other Ambulatory Visit (INDEPENDENT_AMBULATORY_CARE_PROVIDER_SITE_OTHER): Payer: Self-pay

## 2020-11-10 DIAGNOSIS — Z1211 Encounter for screening for malignant neoplasm of colon: Secondary | ICD-10-CM

## 2020-11-25 ENCOUNTER — Telehealth (INDEPENDENT_AMBULATORY_CARE_PROVIDER_SITE_OTHER): Payer: Self-pay | Admitting: *Deleted

## 2020-11-25 NOTE — Telephone Encounter (Signed)
Referring MD: Anderson Malta griffin PCP: [facility]  Procedure: tcs  Reason/Indication:  screening  Has patient had this procedure before?  no  If so, when, by whom and where?    Is there a family history of colon cancer?  no  Who?  What age when diagnosed?    Is patient diabetic? If yes, Type 1 or Type 2   no      Does patient have prosthetic heart valve or mechanical valve?  no  Do you have a pacemaker/defibrillator?  no  Has patient ever had endocarditis/atrial fibrillation? no  Have you had a stroke/heart attack last 6 mths? no  Does patient use oxygen? no  Has patient had joint replacement within last 12 months?  no  Is patient constipated or do they take laxatives? no  Does patient have a history of alcohol/drug use?  no  Is patient on blood thinner such as Coumadin, Plavix and/or Aspirin? no  Do you take medicine for weight loss?  no  For female patients,: do you still have your menstrual cycle?   Medications: levothyroxine 50 mcg daily, birth control, allergy med, one a day vit  Allergies: mkda  Medication Adjustment per Dr Rehman/Dr Jenetta Downer \  Procedure date & time: 12/22/20

## 2020-12-07 ENCOUNTER — Other Ambulatory Visit (HOSPITAL_COMMUNITY): Payer: Self-pay | Admitting: Adult Health

## 2020-12-07 DIAGNOSIS — Z1231 Encounter for screening mammogram for malignant neoplasm of breast: Secondary | ICD-10-CM

## 2020-12-20 ENCOUNTER — Other Ambulatory Visit (HOSPITAL_COMMUNITY)
Admission: RE | Admit: 2020-12-20 | Discharge: 2020-12-20 | Disposition: A | Discharge status: Home / Self Care | Payer: No Typology Code available for payment source | Source: Ambulatory Visit | Attending: Internal Medicine | Admitting: Internal Medicine

## 2020-12-20 ENCOUNTER — Other Ambulatory Visit: Payer: Self-pay

## 2020-12-20 DIAGNOSIS — Z20822 Contact with and (suspected) exposure to covid-19: Secondary | ICD-10-CM | POA: Insufficient documentation

## 2020-12-20 DIAGNOSIS — Z01812 Encounter for preprocedural laboratory examination: Secondary | ICD-10-CM | POA: Diagnosis present

## 2020-12-20 LAB — SARS CORONAVIRUS 2 (TAT 6-24 HRS): SARS Coronavirus 2: NEGATIVE

## 2020-12-22 ENCOUNTER — Other Ambulatory Visit: Payer: Self-pay

## 2020-12-22 ENCOUNTER — Encounter (HOSPITAL_COMMUNITY): Payer: Self-pay | Admitting: Internal Medicine

## 2020-12-22 ENCOUNTER — Ambulatory Visit (HOSPITAL_COMMUNITY)
Admission: RE | Admit: 2020-12-22 | Discharge: 2020-12-22 | Disposition: A | Discharge status: Home / Self Care | Payer: No Typology Code available for payment source | Attending: Internal Medicine | Admitting: Internal Medicine

## 2020-12-22 ENCOUNTER — Encounter (HOSPITAL_COMMUNITY)
Admission: RE | Disposition: A | Discharge status: Home / Self Care | Payer: Self-pay | Source: Home / Self Care | Attending: Internal Medicine

## 2020-12-22 ENCOUNTER — Encounter (INDEPENDENT_AMBULATORY_CARE_PROVIDER_SITE_OTHER): Payer: Self-pay | Admitting: *Deleted

## 2020-12-22 DIAGNOSIS — Z1211 Encounter for screening for malignant neoplasm of colon: Secondary | ICD-10-CM | POA: Insufficient documentation

## 2020-12-22 DIAGNOSIS — Z8261 Family history of arthritis: Secondary | ICD-10-CM | POA: Diagnosis not present

## 2020-12-22 DIAGNOSIS — Z79890 Hormone replacement therapy (postmenopausal): Secondary | ICD-10-CM | POA: Insufficient documentation

## 2020-12-22 DIAGNOSIS — K644 Residual hemorrhoidal skin tags: Secondary | ICD-10-CM | POA: Insufficient documentation

## 2020-12-22 DIAGNOSIS — Z833 Family history of diabetes mellitus: Secondary | ICD-10-CM | POA: Insufficient documentation

## 2020-12-22 DIAGNOSIS — Z7951 Long term (current) use of inhaled steroids: Secondary | ICD-10-CM | POA: Insufficient documentation

## 2020-12-22 DIAGNOSIS — Z79899 Other long term (current) drug therapy: Secondary | ICD-10-CM | POA: Diagnosis not present

## 2020-12-22 DIAGNOSIS — Z8249 Family history of ischemic heart disease and other diseases of the circulatory system: Secondary | ICD-10-CM | POA: Diagnosis not present

## 2020-12-22 HISTORY — PX: COLONOSCOPY: SHX5424

## 2020-12-22 LAB — HM COLONOSCOPY

## 2020-12-22 SURGERY — COLONOSCOPY
Anesthesia: Moderate Sedation

## 2020-12-22 MED ORDER — MIDAZOLAM HCL 5 MG/5ML IJ SOLN
INTRAMUSCULAR | Status: AC
  Filled 2020-12-22: qty 10

## 2020-12-22 MED ORDER — MEPERIDINE HCL 50 MG/ML IJ SOLN
INTRAMUSCULAR | Status: AC
  Filled 2020-12-22: qty 1

## 2020-12-22 MED ORDER — SODIUM CHLORIDE 0.9 % IV SOLN
INTRAVENOUS | Status: DC
  Administered 2020-12-22: 20 mL/h via INTRAVENOUS

## 2020-12-22 MED ORDER — MIDAZOLAM HCL 5 MG/5ML IJ SOLN
INTRAMUSCULAR | Status: DC | PRN
  Administered 2020-12-22: 1 mg via INTRAVENOUS
  Administered 2020-12-22 (×2): 2 mg via INTRAVENOUS
  Administered 2020-12-22: 3 mg via INTRAVENOUS

## 2020-12-22 MED ORDER — MEPERIDINE HCL 50 MG/ML IJ SOLN
INTRAMUSCULAR | Status: DC | PRN
  Administered 2020-12-22 (×3): 25 mg via INTRAVENOUS

## 2020-12-22 MED ORDER — STERILE WATER FOR IRRIGATION IR SOLN
Status: DC | PRN
  Administered 2020-12-22: 1.5 mL

## 2020-12-22 NOTE — H&P (Signed)
Tracy Marks is an 45 y.o. female.   Chief Complaint: Patient is here for colonoscopy. HPI: Patient is 45 year old African-American female who is here for screening colonoscopy.  She denies abdominal pain change in bowel habits or rectal bleeding.  Family history is negative for CRC. She does not take aspirin or anticoagulants.  Past Medical History:  Diagnosis Date  . Contraceptive management 06/17/2013  . Fibroids 08/26/2015  . Heavy periods 07/01/2015  . Hot flashes 07/01/2015  . Obesity   . Sinusitis    and seasonal allergies    Past Surgical History:  Procedure Laterality Date  . CESAREAN SECTION  10/25/1994  . CHOLECYSTECTOMY  12/2005    Family History  Problem Relation Age of Onset  . Heart disease Mother   . Hypertension Mother   . Diabetes Mother   . Hyperlipidemia Father   . Hypertension Maternal Grandmother   . Arthritis Maternal Grandmother   . Ulcers Maternal Grandfather    Social History:  reports that she has never smoked. She has never used smokeless tobacco. She reports that she does not drink alcohol and does not use drugs.  Allergies: No Known Allergies  Medications Prior to Admission  Medication Sig Dispense Refill  . fexofenadine (ALLEGRA) 180 MG tablet Take 180 mg by mouth daily.    Marland Kitchen levothyroxine (SYNTHROID) 50 MCG tablet TAKE 1 TABLET BY MOUTH EVERY DAY BEFORE BREAKFAST (Patient taking differently: Take 50 mcg by mouth daily before breakfast. TAKE 1 TABLET BY MOUTH EVERY DAY BEFORE BREAKFAST) 90 tablet 3  . mometasone (NASONEX) 50 MCG/ACT nasal spray Place 1 spray into the nose daily as needed (allergies).    . Multiple Vitamins-Calcium (ONE-A-DAY WOMENS PO) Take 1 tablet by mouth daily.    . Norethin Ace-Eth Estrad-FE 1-20 MG-MCG(24) CAPS Take 1 tablet by mouth daily. 84 capsule 4    No results found for this or any previous visit (from the past 48 hour(s)). No results found.  Review of Systems  Blood pressure (!) 159/93, pulse 90,  temperature 98.2 F (36.8 C), temperature source Oral, resp. rate (!) 23, height 5\' 4"  (1.626 m), weight 107 kg, last menstrual period 11/22/2020, SpO2 100 %. Physical Exam HENT:     Mouth/Throat:     Mouth: Mucous membranes are moist.     Pharynx: Oropharynx is clear.  Eyes:     General: No scleral icterus.    Conjunctiva/sclera: Conjunctivae normal.  Cardiovascular:     Rate and Rhythm: Normal rate and regular rhythm.     Heart sounds: Normal heart sounds. No murmur heard.   Pulmonary:     Effort: Pulmonary effort is normal.     Breath sounds: Normal breath sounds.  Abdominal:     General: There is no distension.     Palpations: Abdomen is soft. There is no mass.  Musculoskeletal:        General: No swelling.     Cervical back: Neck supple.  Lymphadenopathy:     Cervical: No cervical adenopathy.  Skin:    General: Skin is warm and dry.  Neurological:     Mental Status: She is alert.      Assessment/Plan  Average risk screening colonoscopy  Hildred Laser, MD 12/22/2020, 10:30 AM

## 2020-12-22 NOTE — Op Note (Signed)
[facility] Patient Name: Tracy Marks Procedure Date: 12/22/2020 10:11 AM MRN: [id number] Date of Birth: 1976/03/28 Attending MD: Hildred Laser , MD CSN: [id number] Age: 45 Admit Type: Outpatient Procedure:                Colonoscopy Indications:              Screening for colorectal malignant neoplasm Providers:                Hildred Laser, MD, Otis Peak B. Sharon Seller, RN, Aram Candela Referring MD:             Delman Cheadle, PA-C Medicines:                Meperidine 75 mg IV, Midazolam 8 mg IV Complications:            No immediate complications. Estimated Blood Loss:     Estimated blood loss: none. Procedure:                Pre-Anesthesia Assessment:                           - Prior to the procedure, a History and Physical                            was performed, and patient medications and                            allergies were reviewed. The patient's tolerance of                            previous anesthesia was also reviewed. The risks                            and benefits of the procedure and the sedation                            options and risks were discussed with the patient.                            All questions were answered, and informed consent                            was obtained. Prior Anticoagulants: The patient has                            taken no previous anticoagulant or antiplatelet                            agents. ASA Grade Assessment: II - A patient with                            mild systemic disease. After reviewing the risks  and benefits, the patient was deemed in                            satisfactory condition to undergo the procedure.                           After obtaining informed consent, the colonoscope                            was passed under direct vision. Throughout the                            procedure, the patient's blood pressure, pulse, and                             oxygen saturations were monitored continuously. The                            [id number]-[id number] ([id number]) scope was introduced through                            the anus and advanced to the the cecum, identified                            by appendiceal orifice and ileocecal valve. The                            colonoscopy was performed without difficulty. The                            patient tolerated the procedure well. The quality                            of the bowel preparation was good. Scope In: 10:45:20 AM Scope Out: 11:09:20 AM Scope Withdrawal Time: 0 hours 7 minutes 47 seconds  Total Procedure Duration: 0 hours 24 minutes 0 seconds  Findings:      The perianal and digital rectal examinations were normal.      The colon (entire examined portion) appeared normal.      External hemorrhoids were found during retroflexion. The hemorrhoids       were small. Impression:               - The entire examined colon is normal.                           - External hemorrhoids.                           - No specimens collected. Moderate Sedation:      Moderate (conscious) sedation was administered by the endoscopy nurse       and supervised by the endoscopist. The following parameters were       monitored: oxygen saturation, heart rate, blood pressure, CO2       capnography and response to care. Total physician intraservice time was  33 minutes. Recommendation:           - Patient has a contact number available for                            emergencies. The signs and symptoms of potential                            delayed complications were discussed with the                            patient. Return to normal activities tomorrow.                            Written discharge instructions were provided to the                            patient.                           - Resume previous diet today.                           - Continue present medications.                            - Repeat colonoscopy in 10 years for screening                            purposes. Procedure Code(s):        --- Professional ---                           [id number], Colonoscopy, flexible; diagnostic, including                            collection of specimen(s) by brushing or washing,                            when performed (separate procedure)                           99153, Moderate sedation; each additional 15                            minutes intraservice time                           G0500, Moderate sedation services provided by the                            same physician or other qualified health care                            professional performing a gastrointestinal  endoscopic service that sedation supports,                            requiring the presence of an independent trained                            observer to assist in the monitoring of the                            patient's level of consciousness and physiological                            status; initial 15 minutes of intra-service time;                            patient age 18 years or older (additional time may                            be reported with [phone], as appropriate) Diagnosis Code(s):        --- Professional ---                           Z12.11, Encounter for screening for malignant                            neoplasm of colon                           K64.4, Residual hemorrhoidal skin tags CPT copyright 2019 American Medical Association. All rights reserved. The codes documented in this report are preliminary and upon coder review may  be revised to meet current compliance requirements. Hildred Laser, MD Hildred Laser, MD 12/22/2020 11:16:32 AM This report has been signed electronically. Number of Addenda: 0

## 2020-12-22 NOTE — Discharge Instructions (Signed)
Hemorrhoids Hemorrhoids are swollen veins in and around the rectum or anus. There are two types of hemorrhoids:  Internal hemorrhoids. These occur in the veins that are just inside the rectum. They may poke through to the outside and become irritated and painful.  External hemorrhoids. These occur in the veins that are outside the anus and can be felt as a painful swelling or hard lump near the anus. Most hemorrhoids do not cause serious problems, and they can be managed with home treatments such as diet and lifestyle changes. If home treatments do not help the symptoms, procedures can be done to shrink or remove the hemorrhoids. What are the causes? This condition is caused by increased pressure in the anal area. This pressure may result from various things, including:  Constipation.  Straining to have a bowel movement.  Diarrhea.  Pregnancy.  Obesity.  Sitting for long periods of time.  Heavy lifting or other activity that causes you to strain.  Anal sex.  Riding a bike for a long period of time. What are the signs or symptoms? Symptoms of this condition include:  Pain.  Anal itching or irritation.  Rectal bleeding.  Leakage of stool (feces).  Anal swelling.  One or more lumps around the anus. How is this diagnosed? This condition can often be diagnosed through a visual exam. Other exams or tests may also be done, such as:  An exam that involves feeling the rectal area with a gloved hand (digital rectal exam).  An exam of the anal canal that is done using a small tube (anoscope).  A blood test, if you have lost a significant amount of blood.  A test to look inside the colon using a flexible tube with a camera on the end (sigmoidoscopy or colonoscopy). How is this treated? This condition can usually be treated at home. However, various procedures may be done if dietary changes, lifestyle changes, and other home treatments do not help your symptoms. These  procedures can help make the hemorrhoids smaller or remove them completely. Some of these procedures involve surgery, and others do not. Common procedures include:  Rubber band ligation. Rubber bands are placed at the base of the hemorrhoids to cut off their blood supply.  Sclerotherapy. Medicine is injected into the hemorrhoids to shrink them.  Infrared coagulation. A type of light energy is used to get rid of the hemorrhoids.  Hemorrhoidectomy surgery. The hemorrhoids are surgically removed, and the veins that supply them are tied off.  Stapled hemorrhoidopexy surgery. The surgeon staples the base of the hemorrhoid to the rectal wall. Follow these instructions at home: Eating and drinking  Eat foods that have a lot of fiber in them, such as whole grains, beans, nuts, fruits, and vegetables.  Ask your health care provider about taking products that have added fiber (fiber supplements).  Reduce the amount of fat in your diet. You can do this by eating low-fat dairy products, eating less red meat, and avoiding processed foods.  Drink enough fluid to keep your urine pale yellow.   Managing pain and swelling  Take warm sitz baths for 20 minutes, 3-4 times a day to ease pain and discomfort. You may do this in a bathtub or using a portable sitz bath that fits over the toilet.  If directed, apply ice to the affected area. Using ice packs between sitz baths may be helpful. ? Put ice in a plastic bag. ? Place a towel between your skin and the bag. ? Leave   the ice on for 20 minutes, 2-3 times a day.   General instructions  Take over-the-counter and prescription medicines only as told by your health care provider.  Use medicated creams or suppositories as told.  Get regular exercise. Ask your health care provider how much and what kind of exercise is best for you. In general, you should do moderate exercise for at least 30 minutes on most days of the week (150 minutes each week). This can  include activities such as walking, biking, or yoga.  Go to the bathroom when you have the urge to have a bowel movement. Do not wait.  Avoid straining to have bowel movements.  Keep the anal area dry and clean. Use wet toilet paper or moist towelettes after a bowel movement.  Do not sit on the toilet for long periods of time. This increases blood pooling and pain.  Keep all follow-up visits as told by your health care provider. This is important. Contact a health care provider if you have:  Increasing pain and swelling that are not controlled by treatment or medicine.  Difficulty having a bowel movement, or you are unable to have a bowel movement.  Pain or inflammation outside the area of the hemorrhoids. Get help right away if you have:  Uncontrolled bleeding from your rectum. Summary  Hemorrhoids are swollen veins in and around the rectum or anus.  Most hemorrhoids can be managed with home treatments such as diet and lifestyle changes.  Taking warm sitz baths can help ease pain and discomfort.  In severe cases, procedures or surgery can be done to shrink or remove the hemorrhoids. This information is not intended to replace advice given to you by your health care provider. Make sure you discuss any questions you have with your health care provider. Document Revised: 12/12/2018 Document Reviewed: 12/05/2017 Elsevier Patient Education  2021 [place]. Colonoscopy, Adult, Care After This sheet gives you information about how to care for yourself after your procedure. Your health care provider may also give you more specific instructions. If you have problems or questions, contact your health care provider. What can I expect after the procedure? After the procedure, it is common to have:  A small amount of blood in your stool for 24 hours after the procedure.  Some gas.  Mild cramping or bloating of your abdomen. Follow these instructions at home: Eating and  drinking  Drink enough fluid to keep your urine pale yellow.  Follow instructions from your health care provider about eating or drinking restrictions.  Resume your normal diet as instructed by your health care provider. Avoid heavy or fried foods that are hard to digest.   Activity  Rest as told by your health care provider.  Avoid sitting for a long time without moving. Get up to take short walks every 1-2 hours. This is important to improve blood flow and breathing. Ask for help if you feel weak or unsteady.  Return to your normal activities as told by your health care provider. Ask your health care provider what activities are safe for you. Managing cramping and bloating  Try walking around when you have cramps or feel bloated.  Apply heat to your abdomen as told by your health care provider. Use the heat source that your health care provider recommends, such as a moist heat pack or a heating pad. ? Place a towel between your skin and the heat source. ? Leave the heat on for 20-30 minutes. ? Remove the heat  if your skin turns bright red. This is especially important if you are unable to feel pain, heat, or cold. You may have a greater risk of getting burned.   General instructions  If you were given a sedative during the procedure, it can affect you for several hours. Do not drive or operate machinery until your health care provider says that it is safe.  For the first 24 hours after the procedure: ? Do not sign important documents. ? Do not drink alcohol. ? Do your regular daily activities at a slower pace than normal. ? Eat soft foods that are easy to digest.  Take over-the-counter and prescription medicines only as told by your health care provider.  Keep all follow-up visits as told by your health care provider. This is important. Contact a health care provider if:  You have blood in your stool 2-3 days after the procedure. Get help right away if you have:  More than a  small spotting of blood in your stool.  Large blood clots in your stool.  Swelling of your abdomen.  Nausea or vomiting.  A fever.  Increasing pain in your abdomen that is not relieved with medicine. Summary  After the procedure, it is common to have a small amount of blood in your stool. You may also have mild cramping and bloating of your abdomen.  If you were given a sedative during the procedure, it can affect you for several hours. Do not drive or operate machinery until your health care provider says that it is safe.  Get help right away if you have a lot of blood in your stool, nausea or vomiting, a fever, or increased pain in your abdomen. This information is not intended to replace advice given to you by your health care provider. Make sure you discuss any questions you have with your health care provider. Document Revised: 07/10/2019 Document Reviewed: 02/09/2019 Elsevier Patient Education  [place] usual medications and diet as before. No driving for 24 hours. Next screening exam in 10 years.

## 2020-12-30 ENCOUNTER — Encounter (HOSPITAL_COMMUNITY): Payer: Self-pay | Admitting: Internal Medicine

## 2021-03-09 ENCOUNTER — Other Ambulatory Visit: Payer: Self-pay

## 2021-03-09 ENCOUNTER — Encounter: Payer: Self-pay | Admitting: Adult Health

## 2021-03-09 ENCOUNTER — Ambulatory Visit (INDEPENDENT_AMBULATORY_CARE_PROVIDER_SITE_OTHER): Payer: No Typology Code available for payment source | Admitting: Adult Health

## 2021-03-09 VITALS — BP 156/91 | HR 80 | Ht 64.0 in | Wt 240.5 lb

## 2021-03-09 DIAGNOSIS — I10 Essential (primary) hypertension: Secondary | ICD-10-CM | POA: Diagnosis not present

## 2021-03-09 MED ORDER — AMLODIPINE BESYLATE 5 MG PO TABS: 5.0000 mg | ORAL_TABLET | Freq: Every day | ORAL | 3 refills | Status: DC

## 2021-03-09 NOTE — Progress Notes (Signed)
  Subjective:     Patient ID: Tracy Marks, female   DOB: Jul 18, 1976, 45 y.o.   MRN: [id number]  HPI Tracy Marks is a 45 year old black female,married, G1P1 in for bP check, and it has been elevated at home. PCP is [place].  Review of Systems    Patient denies any daily headaches,(some sinus headaches at times) hearing loss, fatigue, blurred vision, shortness of breath, chest pain, abdominal pain, problems with bowel movements, urination, or intercourse. No joint pain or mood swings.  Reviewed past medical,surgical, social and family history. Reviewed medications and allergies.  Objective:   Physical Exam BP (!) 156/91 (BP Location: Left Arm, Patient Position: Sitting, Cuff Size: Large)   Pulse 80   Ht '5\' 4"'$  (1.626 m)   Wt 240 lb 8 oz (109.1 kg)   LMP 02/17/2021 (Approximate)   BMI 41.28 kg/m  Skin warm and dry. Lungs: clear to ausculation bilaterally. Cardiovascular: regular rate and rhythm. No swelling noted Fall risk is low  Upstream - 03/09/21 0837       Pregnancy Intention Screening   Does the patient want to become pregnant in the next year? No    Does the patient's partner want to become pregnant in the next year? No    Would the patient like to discuss contraceptive options today? No      Contraception Wrap Up   Current Method Oral Contraceptive    End Method Oral Contraceptive    Contraception Counseling Provided No                Assessment:     1. Hypertension, unspecified type Will start on norvasc 5 mg and decrease salt and sugars and increase exercise  Meds ordered this encounter  Medications   amLODipine (NORVASC) 5 MG tablet    Sig: Take 1 tablet (5 mg total) by mouth daily.    Dispense:  30 tablet    Refill:  3    Order Specific Question:   Supervising Provider    Answer:   Tania Ade H [[id number]]      Review DASH diet  Plan:     Follow up in 8 weeks

## 2021-03-17 ENCOUNTER — Other Ambulatory Visit: Payer: Self-pay

## 2021-03-17 ENCOUNTER — Ambulatory Visit (HOSPITAL_COMMUNITY)
Admission: RE | Admit: 2021-03-17 | Discharge: 2021-03-17 | Disposition: A | Discharge status: Home / Self Care | Payer: No Typology Code available for payment source | Source: Ambulatory Visit | Attending: Adult Health | Admitting: Adult Health

## 2021-03-17 DIAGNOSIS — Z1231 Encounter for screening mammogram for malignant neoplasm of breast: Secondary | ICD-10-CM | POA: Insufficient documentation

## 2021-05-04 ENCOUNTER — Other Ambulatory Visit: Payer: Self-pay

## 2021-05-04 ENCOUNTER — Ambulatory Visit (INDEPENDENT_AMBULATORY_CARE_PROVIDER_SITE_OTHER): Payer: No Typology Code available for payment source | Admitting: Adult Health

## 2021-05-04 ENCOUNTER — Encounter: Payer: Self-pay | Admitting: Adult Health

## 2021-05-04 VITALS — BP 143/86 | HR 94 | Ht 64.0 in | Wt 234.0 lb

## 2021-05-04 DIAGNOSIS — I10 Essential (primary) hypertension: Secondary | ICD-10-CM | POA: Diagnosis not present

## 2021-05-04 MED ORDER — AMLODIPINE BESYLATE 5 MG PO TABS: 5.0000 mg | ORAL_TABLET | Freq: Every day | ORAL | 6 refills | Status: DC

## 2021-05-04 NOTE — Progress Notes (Addendum)
  Subjective:     Patient ID: Tracy Marks, female   DOB: Oct 27, 1975, 45 y.o.   MRN: [id number]  HPI Tracy Marks is a 45 year old black female, married, G1P1 in for BP check she is on Norvasc 5 mg and has made dietary changes and is walking more. No complaints. BP at home 130/80 range.  Lab Results  Component Value Date   DIAGPAP  09/15/2020    - Negative for intraepithelial lesion or malignancy (NILM)   HPV NOT DETECTED 07/28/2018   [place] Negative 09/15/2020  PCP is Dr Gerarda Fraction.   Review of Systems Denies any headaches, swelling, no complaints Reviewed past medical,surgical, social and family history. Reviewed medications and allergies.     Objective:   Physical Exam BP (!) 143/86 (BP Location: Left Arm, Patient Position: Sitting, Cuff Size: Large)   Pulse 94   Ht 5\' 4"  (1.626 m)   Wt 234 lb (106.1 kg)   LMP 04/10/2021   BMI 40.17 kg/m  Skin warm and dry. Lungs: clear to ausculation bilaterally. Cardiovascular: regular rate and rhythm.    Has lost 6.5 lbs since August.  Fall risk is low Depression screen [facility] 2/9 05/04/2021 09/15/2020 09/15/2019  Decreased Interest 0 0 0  Down, Depressed, Hopeless 0 0 0  PHQ - 2 Score 0 0 0  Altered sleeping - 0 -  Tired, decreased energy - 0 -  Change in appetite - 0 -  Feeling bad or failure about yourself  - 0 -  Trouble concentrating - 0 -  Moving slowly or fidgety/restless - 0 -  Suicidal thoughts - 0 -  PHQ-9 Score - 0 -       Upstream - 05/04/21 0853       Pregnancy Intention Screening   Does the patient want to become pregnant in the next year? No    Does the patient's partner want to become pregnant in the next year? No    Would the patient like to discuss contraceptive options today? No      Contraception Wrap Up   Current Method Oral Contraceptive    End Method Oral Contraceptive    Contraception Counseling Provided No                1. Hypertension, unspecified type Will continue Norvasc 5 mg Meds ordered  this encounter  Medications   amLODipine (NORVASC) 5 MG tablet    Sig: Take 1 tablet (5 mg total) by mouth daily.    Dispense:  30 tablet    Refill:  6    Order Specific Question:   Supervising Provider    Answer:   Florian Buff [[id number]]      Praised over efforts, continue  Plan:     Return 09/18/21 for physical and BP check

## 2021-09-18 ENCOUNTER — Other Ambulatory Visit: Payer: Self-pay

## 2021-09-18 ENCOUNTER — Ambulatory Visit (INDEPENDENT_AMBULATORY_CARE_PROVIDER_SITE_OTHER): Payer: No Typology Code available for payment source | Admitting: Adult Health

## 2021-09-18 ENCOUNTER — Encounter: Payer: Self-pay | Admitting: Adult Health

## 2021-09-18 VITALS — BP 131/79 | HR 80 | Ht 64.5 in | Wt 234.0 lb

## 2021-09-18 DIAGNOSIS — Z01419 Encounter for gynecological examination (general) (routine) without abnormal findings: Secondary | ICD-10-CM | POA: Diagnosis not present

## 2021-09-18 DIAGNOSIS — E7800 Pure hypercholesterolemia, unspecified: Secondary | ICD-10-CM | POA: Diagnosis not present

## 2021-09-18 DIAGNOSIS — E039 Hypothyroidism, unspecified: Secondary | ICD-10-CM

## 2021-09-18 DIAGNOSIS — Z3041 Encounter for surveillance of contraceptive pills: Secondary | ICD-10-CM

## 2021-09-18 DIAGNOSIS — Z1211 Encounter for screening for malignant neoplasm of colon: Secondary | ICD-10-CM

## 2021-09-18 DIAGNOSIS — I10 Essential (primary) hypertension: Secondary | ICD-10-CM

## 2021-09-18 LAB — HEMOCCULT GUIAC POC 1CARD (OFFICE): Fecal Occult Blood, POC: NEGATIVE

## 2021-09-18 MED ORDER — AMLODIPINE BESYLATE 5 MG PO TABS: 5.0000 mg | ORAL_TABLET | Freq: Every day | ORAL | 6 refills | Status: DC

## 2021-09-18 MED ORDER — LEVOTHYROXINE SODIUM 50 MCG PO TABS: ORAL_TABLET | ORAL | 3 refills | Status: DC

## 2021-09-18 MED ORDER — NORETHIN ACE-ETH ESTRAD-FE 1-20 MG-MCG(24) PO CAPS: 1.0000 | ORAL_CAPSULE | Freq: Every day | ORAL | 4 refills | Status: DC

## 2021-09-18 NOTE — Progress Notes (Signed)
Patient ID: Tracy Marks, female   DOB: 01/08/1976, 46 y.o.   MRN: [id number] History of Present Illness: Tracy Marks is a 46 year old black female, married, G1P1001, in for a well woman gyn exam.  She has not seen Dr Gerarda Fraction, in years, looking for new PCP. Lab Results  Component Value Date   DIAGPAP  09/15/2020    - Negative for intraepithelial lesion or malignancy (NILM)   HPV NOT DETECTED 07/28/2018   [place] Negative 09/15/2020      Current Medications, Allergies, Past Medical History, Past Surgical History, Family History and Social History were reviewed in [employer] record.     Review of Systems: Patient denies any headaches, hearing loss, fatigue, blurred vision, shortness of breath, chest pain, abdominal pain, problems with bowel movements, urination, or intercourse. No joint pain or mood swings.     Physical Exam:BP 131/79 (BP Location: Left Arm, Patient Position: Sitting, Cuff Size: Large)    Pulse 80    Ht 5' 4.5" (1.638 m)    Wt 234 lb (106.1 kg)    LMP 09/01/2021    BMI 39.55 kg/m   General:  Well developed, well nourished, no acute distress Skin:  Warm and dry Neck:  Midline trachea, normal thyroid, good ROM, no lymphadenopathy Lungs; Clear to auscultation bilaterally Breast:  No dominant palpable mass, retraction, or nipple discharge Cardiovascular: Regular rate and rhythm Abdomen:  Soft, non tender, no hepatosplenomegaly Pelvic:  External genitalia is normal in appearance, no lesions.  The vagina is normal in appearance. Urethra has no lesions or masses. The cervix is bulbous.  Uterus is enlarged, about 14 week size, has known fibroids.  No adnexal masses or tenderness noted.Bladder is non tender, no masses felt. Rectal: Good sphincter tone, no polyps, or hemorrhoids felt.  Hemoccult negative. Extremities/musculoskeletal:  No swelling or varicosities noted, no clubbing or cyanosis Psych:  No mood changes, alert and cooperative,seems  happy AA is 0 Fall risk is low Depression screen [facility] 2/9 09/18/2021 05/04/2021 09/15/2020  Decreased Interest 0 0 0  Down, Depressed, Hopeless 0 0 0  PHQ - 2 Score 0 0 0  Altered sleeping 0 - 0  Tired, decreased energy 0 - 0  Change in appetite 0 - 0  Feeling bad or failure about yourself  0 - 0  Trouble concentrating 0 - 0  Moving slowly or fidgety/restless 0 - 0  Suicidal thoughts 0 - 0  PHQ-9 Score 0 - 0    GAD 7 : Generalized Anxiety Score 09/18/2021 09/15/2020  Nervous, Anxious, on Edge 0 0  Control/stop worrying 0 0  Worry too much - different things 0 0  Trouble relaxing 0 0  Restless 0 0  Easily annoyed or irritable 0 0  Afraid - awful might happen 0 0  Total GAD 7 Score 0 0    Upstream - 09/18/21 0841       Pregnancy Intention Screening   Does the patient want to become pregnant in the next year? No    Does the patient's partner want to become pregnant in the next year? No    Would the patient like to discuss contraceptive options today? No      Contraception Wrap Up   Current Method Oral Contraceptive    End Method Oral Contraceptive    Contraception Counseling Provided No            Examination chaperoned by Levy Pupa LPN    Impression and Plan: 1. Encounter for  well woman exam with routine gynecological exam Physical in 1 year Mammogram yearly Colonoscopy per GI Will get labs - CBC - Comprehensive metabolic panel - Lipid panel She will let me know when gets new PCP  2. Encounter for screening fecal occult blood testing  - POCT occult blood stool  3. Encounter for surveillance of contraceptive pills Continue OCs, happy with them, will refill   Meds ordered this encounter  Medications   amLODipine (NORVASC) 5 MG tablet    Sig: Take 1 tablet (5 mg total) by mouth daily.    Dispense:  30 tablet    Refill:  6    Order Specific Question:   Supervising Provider    Answer:   Florian Buff [[id number]]   levothyroxine (SYNTHROID) 50 MCG tablet     Sig: TAKE 1 TABLET BY MOUTH EVERY DAY BEFORE BREAKFAST    Dispense:  90 tablet    Refill:  3    Order Specific Question:   Supervising Provider    Answer:   Elonda Husky, LUTHER H [[id number]]   Norethin Ace-Eth Estrad-FE 1-20 MG-MCG(24) CAPS    Sig: Take 1 tablet by mouth daily.    Dispense:  84 capsule    Refill:  4    Order Specific Question:   Supervising Provider    Answer:   Elonda Husky, LUTHER H [[id number]]     4. Elevated cholesterol  - Lipid panel  5. Hypothyroidism, unspecified type Will check TSH and Free T4 and refill synthroid 50 mcg daily - TSH - T4, free  6. Hypertension, unspecified type Continue Norvasc 5 mg daily, will refill She has lost weight and is watching salt  - Comprehensive metabolic panel

## 2021-09-19 LAB — T4, FREE: Free T4: 1.07 ng/dL (ref 0.82–1.77)

## 2021-09-19 LAB — LIPID PANEL
Chol/HDL Ratio: 2.9 ratio (ref 0.0–4.4)
Cholesterol, Total: 197 mg/dL (ref 100–199)
HDL: 67 mg/dL (ref >39)
LDL Chol Calc (NIH): 116 mg/dL — ABNORMAL HIGH (ref 0–99)
Triglycerides: 77 mg/dL (ref 0–149)
VLDL Cholesterol Cal: 14 mg/dL (ref 5–40)

## 2021-09-19 LAB — CBC
Hematocrit: 36.2 % (ref 34.0–46.6)
Hemoglobin: 12 g/dL (ref 11.1–15.9)
MCH: 31.5 pg (ref 26.6–33.0)
MCHC: 33.1 g/dL (ref 31.5–35.7)
MCV: 95 fL (ref 79–97)
Platelets: 433 10*3/uL (ref 150–450)
RBC: 3.81 x10E6/uL (ref 3.77–5.28)
RDW: 13.4 % (ref 11.7–15.4)
WBC: 6.6 10*3/uL (ref 3.4–10.8)

## 2021-09-19 LAB — COMPREHENSIVE METABOLIC PANEL
ALT: 8 IU/L (ref 0–32)
AST: 18 IU/L (ref 0–40)
Albumin/Globulin Ratio: 1.3 (ref 1.2–2.2)
Albumin: 4.1 g/dL (ref 3.8–4.8)
Alkaline Phosphatase: 122 IU/L — ABNORMAL HIGH (ref 44–121)
BUN/Creatinine Ratio: 18 (ref 9–23)
BUN: 13 mg/dL (ref 6–24)
Bilirubin Total: 0.4 mg/dL (ref 0.0–1.2)
CO2: 21 mmol/L (ref 20–29)
Calcium: 8.9 mg/dL (ref 8.7–10.2)
Chloride: 102 mmol/L (ref 96–106)
Creatinine, Ser: 0.71 mg/dL (ref 0.57–1.00)
Globulin, Total: 3.2 g/dL (ref 1.5–4.5)
Glucose: 80 mg/dL (ref 70–99)
Potassium: 4.2 mmol/L (ref 3.5–5.2)
Sodium: 137 mmol/L (ref 134–144)
Total Protein: 7.3 g/dL (ref 6.0–8.5)
eGFR: 105 mL/min/{1.73_m2} (ref >59)

## 2021-09-19 LAB — TSH: TSH: 3.35 u[IU]/mL (ref 0.450–4.500)

## 2021-12-01 ENCOUNTER — Ambulatory Visit: Payer: No Typology Code available for payment source | Admitting: Nurse Practitioner

## 2021-12-06 ENCOUNTER — Encounter: Payer: Self-pay | Admitting: Nurse Practitioner

## 2021-12-06 ENCOUNTER — Ambulatory Visit (INDEPENDENT_AMBULATORY_CARE_PROVIDER_SITE_OTHER): Payer: No Typology Code available for payment source | Admitting: Nurse Practitioner

## 2021-12-06 VITALS — BP 130/76 | HR 97 | Ht 64.0 in | Wt 235.0 lb

## 2021-12-06 DIAGNOSIS — E039 Hypothyroidism, unspecified: Secondary | ICD-10-CM | POA: Diagnosis not present

## 2021-12-06 DIAGNOSIS — I10 Essential (primary) hypertension: Secondary | ICD-10-CM

## 2021-12-06 DIAGNOSIS — E7800 Pure hypercholesterolemia, unspecified: Secondary | ICD-10-CM

## 2021-12-06 DIAGNOSIS — Z139 Encounter for screening, unspecified: Secondary | ICD-10-CM

## 2021-12-06 DIAGNOSIS — E669 Obesity, unspecified: Secondary | ICD-10-CM | POA: Insufficient documentation

## 2021-12-06 NOTE — Assessment & Plan Note (Signed)
Lab Results  ?Component Value Date  ? CHOL 197 09/18/2021  ? HDL 67 09/18/2021  ? LDLCALC 116 (H) 09/18/2021  ? TRIG 77 09/18/2021  ? CHOLHDL 2.9 09/18/2021  ?Avoid fatty fried foods, engage in regular vigorous exercise at least 150 minutes weekly ?

## 2021-12-06 NOTE — Progress Notes (Signed)
? ?New Patient Office Visit ? ?Subjective   ? ?Patient ID: Tracy Marks, female    DOB: February 04, 1976  Age: 46 y.o. MRN: [id number] ? ?CC:  ?Chief Complaint  ?Patient presents with  ? New Patient (Initial Visit)  ?  NP  ? ? ?HPI ?Tracy Marks with past medical history of hypertension, hypothyroidism, elevated cholesterol, obesity, presents to establish care for her chronic medical conditions, previous PCP was at [facility] [place] , its been years ago since last visit.  ? ?[facility]. Last annual physical with labs was this year.  ? ?Up to date with PAP, colonoscopy and mammogram due for Tdap vaccine need for vaccine discussed with patient patient declined vaccination today. ? ?Hypertension.  Takes amlodipine 5 mg tablet daily patient denies chest pain, syncope, edema, dizziness. ? ?Hypothyroidism.  On levothyroxine 50 mcg tablets daily patient denies any adverse reaction to medication.  ? ? ?Outpatient Encounter Medications as of 12/06/2021  ?Medication Sig  ? amLODipine (NORVASC) 5 MG tablet Take 1 tablet (5 mg total) by mouth daily.  ? fexofenadine (ALLEGRA) 180 MG tablet Take 180 mg by mouth daily.  ? levothyroxine (SYNTHROID) 50 MCG tablet TAKE 1 TABLET BY MOUTH EVERY DAY BEFORE BREAKFAST  ? mometasone (NASONEX) 50 MCG/ACT nasal spray Place 1 spray into the nose daily as needed (allergies).  ? Multiple Vitamins-Calcium (ONE-A-DAY WOMENS PO) Take 1 tablet by mouth every other day.  ? Norethin Ace-Eth Estrad-FE 1-20 MG-MCG(24) CAPS Take 1 tablet by mouth daily.  ? ?No facility-administered encounter medications on file as of 12/06/2021.  ? ? ?Past Medical History:  ?Diagnosis Date  ? Contraceptive management 06/17/2013  ? Fibroids 08/26/2015  ? Heavy periods 07/01/2015  ? Hot flashes 07/01/2015  ? Hyperlipidemia   ? Hypertension   ? Obesity   ? Sinusitis   ? and seasonal allergies  ? ? ?Past Surgical History:  ?Procedure Laterality Date  ? CESAREAN SECTION   10/25/1994  ? CHOLECYSTECTOMY  12/2005  ? COLONOSCOPY N/A 12/22/2020  ? Procedure: COLONOSCOPY;  Surgeon: Rogene Houston, MD;  Location: [facility];  Service: Endoscopy;  Laterality: N/A;  am  ? ? ?Family History  ?Problem Relation Age of Onset  ? Heart disease Mother   ? Hypertension Mother   ? Diabetes Mother   ? Hyperlipidemia Father   ? Hypertension Father   ? Hypertension Maternal Grandmother   ? Arthritis Maternal Grandmother   ? Ulcers Maternal Grandfather   ? Breast cancer Neg Hx   ? Colon cancer Neg Hx   ? ? ?Social History  ? ?Socioeconomic History  ? Marital status: Married  ?  Spouse name: Not on file  ? Number of children: 1  ? Years of education: Not on file  ? Highest education level: Not on file  ?Occupational History  ? Not on file  ?Tobacco Use  ? Smoking status: Never  ? Smokeless tobacco: Never  ?Vaping Use  ? Vaping Use: Never used  ?Substance and Sexual Activity  ? Alcohol use: No  ? Drug use: No  ? Sexual activity: Yes  ?  Birth control/protection: Pill  ?Other Topics Concern  ? Not on file  ?Social History Narrative  ? Lives with her spouse, works for [employer]  ? ?Social Determinants of Health  ? ?Financial Resource Strain: Low Risk   ? Difficulty of Paying Living Expenses: Not hard at all  ?Food Insecurity: No Food Insecurity  ?  Worried About Charity fundraiser in the Last Year: Never true  ? Ran Out of Food in the Last Year: Never true  ?Transportation Needs: No Transportation Needs  ? Lack of Transportation (Medical): No  ? Lack of Transportation (Non-Medical): No  ?Physical Activity: Insufficiently Active  ? Days of Exercise per Week: 2 days  ? Minutes of Exercise per Session: 30 min  ?Stress: No Stress Concern Present  ? Feeling of Stress : Not at all  ?Social Connections: Moderately Integrated  ? Frequency of Communication with Friends and Family: More than three times a week  ? Frequency of Social Gatherings with Friends and Family: Once a week  ? Attends Religious Services: 1 to 4  times per year  ? Active Member of Clubs or Organizations: No  ? Attends Archivist Meetings: Never  ? Marital Status: Married  ?Intimate Partner Violence: Not At Risk  ? Fear of Current or Ex-Partner: No  ? Emotionally Abused: No  ? Physically Abused: No  ? Sexually Abused: No  ? ? ?Review of Systems  ?Constitutional: Negative.   ?Respiratory: Negative.    ?Cardiovascular: Negative.   ?Gastrointestinal: Negative.   ?Skin: Negative.   ?Neurological: Negative.   ?Psychiatric/Behavioral: Negative.    ? ?  ? ? ?Objective   ? ?BP 130/76 (BP Location: Right Arm, Cuff Size: Large)   Pulse 97   Ht '5\' 4"'$  (1.626 m)   Wt 235 lb (106.6 kg)   LMP 11/28/2021 (Approximate)   SpO2 100%   BMI 40.34 kg/m?  ? ?Physical Exam ?Constitutional:   ?   General: She is not in acute distress. ?   Appearance: She is obese. She is not ill-appearing, toxic-appearing or diaphoretic.  ?Cardiovascular:  ?   Rate and Rhythm: Normal rate and regular rhythm.  ?   Pulses: Normal pulses.  ?   Heart sounds: Normal heart sounds. No murmur heard. ?  No friction rub. No gallop.  ?Pulmonary:  ?   Effort: Pulmonary effort is normal. No respiratory distress.  ?   Breath sounds: Normal breath sounds. No stridor. No wheezing, rhonchi or rales.  ?Chest:  ?   Chest wall: No tenderness.  ?Skin: ?   General: Skin is warm and dry.  ?   Capillary Refill: Capillary refill takes less than 2 seconds.  ?Neurological:  ?   Mental Status: She is alert and oriented to person, place, and time.  ?Psychiatric:     ?   Mood and Affect: Mood normal.     ?   Behavior: Behavior normal.     ?   Thought Content: Thought content normal.     ?   Judgment: Judgment normal.  ? ? ? ?  ? ?Assessment & Plan:  ? ?Problem List Items Addressed This Visit   ? ?  ? Cardiovascular and Mediastinum  ? Hypertension - Primary  ?  BP Readings from Last 3 Encounters:  ?12/06/21 130/76  ?09/18/21 131/79  ?05/04/21 (!) 143/86  ?Chronic condition well-controlled on amlodipine 5 mg  daily ?Continue current medication ?DASH diet advised engage in regular vigorous exercise at least 150 minutes weekly ?Follow-up in 5 months ?  ?  ?  ? Endocrine  ? Hypothyroidism  ?  Lab Results  ?Component Value Date  ? TSH 3.350 09/18/2021  ?Chronic condition well-controlled on Synthroid 50 mcg tablets daily.  Continue current medication ?  ?  ?  ? Other  ? Elevated cholesterol  ?  Lab Results  ?Component Value Date  ? CHOL 197 09/18/2021  ? HDL 67 09/18/2021  ? LDLCALC 116 (H) 09/18/2021  ? TRIG 77 09/18/2021  ? CHOLHDL 2.9 09/18/2021  ?Avoid fatty fried foods, engage in regular vigorous exercise at least 150 minutes weekly ?  ?  ? Obesity  ?  Wt Readings from Last 3 Encounters:  ?12/06/21 235 lb (106.6 kg)  ?09/18/21 234 lb (106.1 kg)  ?05/04/21 234 lb (106.1 kg)  ?Patient states that she work from home and does some exercise at home. ?Need to increase intake of whole food consisting mainly vegetables and protein less carbohydrate drinking at least 64 ounces of water daily instead of juice importance of engaging in regular vigorous exercises at least for 50 minutes weekly, portion control discussed with patient she verbalized understanding.  ?  ?  ? ?Other Visit Diagnoses   ? ? Screening due      ? Relevant Orders  ? HgB A1c  ? Vitamin D (25 hydroxy)  ? ?  ? ? ?Return in about 5 months (around 05/08/2022) for HTN/HLD/HYPOTHYROIDISM.  ? ?Renee Rival, FNP ? ? ?

## 2021-12-06 NOTE — Patient Instructions (Signed)

## 2021-12-06 NOTE — Assessment & Plan Note (Signed)
Lab Results  ?Component Value Date  ? TSH 3.350 09/18/2021  ?Chronic condition well-controlled on Synthroid 50 mcg tablets daily.  Continue current medication ?

## 2021-12-06 NOTE — Assessment & Plan Note (Addendum)
Wt Readings from Last 3 Encounters:  ?12/06/21 235 lb (106.6 kg)  ?09/18/21 234 lb (106.1 kg)  ?05/04/21 234 lb (106.1 kg)  ?Patient states that she work from home and does some exercise at home. ?Need to increase intake of whole food consisting mainly vegetables and protein less carbohydrate drinking at least 64 ounces of water daily instead of juice importance of engaging in regular vigorous exercises at least for 50 minutes weekly, portion control discussed with patient she verbalized understanding.  ?

## 2021-12-06 NOTE — Assessment & Plan Note (Signed)
BP Readings from Last 3 Encounters:  ?12/06/21 130/76  ?09/18/21 131/79  ?05/04/21 (!) 143/86  ?Chronic condition well-controlled on amlodipine 5 mg daily ?Continue current medication ?DASH diet advised engage in regular vigorous exercise at least 150 minutes weekly ?Follow-up in 5 months ?

## 2021-12-07 ENCOUNTER — Telehealth: Payer: Self-pay

## 2021-12-07 ENCOUNTER — Other Ambulatory Visit: Payer: Self-pay | Admitting: Adult Health

## 2021-12-07 LAB — HEMOGLOBIN A1C
Est. average glucose Bld gHb Est-mCnc: 103 mg/dL
Hgb A1c MFr Bld: 5.2 % (ref 4.8–5.6)

## 2021-12-07 LAB — VITAMIN D 25 HYDROXY (VIT D DEFICIENCY, FRACTURES): Vit D, 25-Hydroxy: 43.5 ng/mL (ref 30.0–100.0)

## 2021-12-07 NOTE — Telephone Encounter (Signed)
Patient called for test results

## 2021-12-07 NOTE — Progress Notes (Signed)
Patient does not have diabetes vitamin D levels is normal continue current medications

## 2021-12-08 NOTE — Telephone Encounter (Signed)
Patient aware.

## 2022-02-02 ENCOUNTER — Other Ambulatory Visit (HOSPITAL_COMMUNITY): Payer: Self-pay | Admitting: Adult Health

## 2022-02-02 DIAGNOSIS — Z1231 Encounter for screening mammogram for malignant neoplasm of breast: Secondary | ICD-10-CM

## 2022-03-19 ENCOUNTER — Ambulatory Visit: Payer: No Typology Code available for payment source | Admitting: Adult Health

## 2022-03-19 ENCOUNTER — Ambulatory Visit (HOSPITAL_COMMUNITY)
Admission: RE | Admit: 2022-03-19 | Discharge: 2022-03-19 | Disposition: A | Discharge status: Home / Self Care | Payer: No Typology Code available for payment source | Source: Ambulatory Visit | Attending: Adult Health | Admitting: Adult Health

## 2022-03-19 DIAGNOSIS — Z1231 Encounter for screening mammogram for malignant neoplasm of breast: Secondary | ICD-10-CM | POA: Insufficient documentation

## 2022-03-20 ENCOUNTER — Ambulatory Visit (INDEPENDENT_AMBULATORY_CARE_PROVIDER_SITE_OTHER): Payer: No Typology Code available for payment source | Admitting: Adult Health

## 2022-03-20 ENCOUNTER — Encounter: Payer: Self-pay | Admitting: Adult Health

## 2022-03-20 VITALS — BP 135/77 | HR 80 | Ht 64.0 in | Wt 233.0 lb

## 2022-03-20 DIAGNOSIS — I10 Essential (primary) hypertension: Secondary | ICD-10-CM

## 2022-03-20 MED ORDER — AMLODIPINE BESYLATE 5 MG PO TABS: 5.0000 mg | ORAL_TABLET | Freq: Every day | ORAL | 6 refills | Status: DC

## 2022-03-20 NOTE — Progress Notes (Signed)
  Subjective:     Patient ID: Tracy Marks, female   DOB: September 10, 1975, 46 y.o.   MRN: [id number]  HPI Tracy Marks is a 46 year old black female,married, G1P1 in for 6 month BP check and ROS. Lab Results  Component Value Date   DIAGPAP  09/15/2020    - Negative for intraepithelial lesion or malignancy (NILM)   HPV NOT DETECTED 07/28/2018   [place] Negative 09/15/2020   PCP is F Paseda NP.  Review of Systems Feels good, working on [employer]  Reviewed past medical,surgical, social and family history. Reviewed medications and allergies.     Objective:   Physical Exam BP 135/77 (BP Location: Left Arm, Patient Position: Sitting, Cuff Size: Large)   Pulse 80   Ht '5\' 4"'$  (1.626 m)   Wt 233 lb (105.7 kg)   LMP 03/17/2022   BMI 39.99 kg/m     Skin warm and dry. Lungs: clear to ausculation bilaterally. Cardiovascular: regular rate and rhythm.   Upstream - 03/20/22 0855       Pregnancy Intention Screening   Does the patient want to become pregnant in the next year? No    Does the patient's partner want to become pregnant in the next year? No    Would the patient like to discuss contraceptive options today? No      Contraception Wrap Up   Current Method Oral Contraceptive    End Method Oral Contraceptive             Assessment:      1. Hypertension, unspecified type BP is good Continue Norvasc 5 mg, 1 daily    Plan:     Return in February for physical with me or sooner if needed

## 2022-05-08 ENCOUNTER — Encounter: Payer: Self-pay | Admitting: Family Medicine

## 2022-05-08 ENCOUNTER — Ambulatory Visit: Payer: No Typology Code available for payment source | Admitting: Nurse Practitioner

## 2022-05-08 ENCOUNTER — Ambulatory Visit (INDEPENDENT_AMBULATORY_CARE_PROVIDER_SITE_OTHER): Payer: No Typology Code available for payment source | Admitting: Family Medicine

## 2022-05-08 VITALS — BP 134/80 | HR 91 | Ht 64.0 in | Wt 237.1 lb

## 2022-05-08 DIAGNOSIS — Z1159 Encounter for screening for other viral diseases: Secondary | ICD-10-CM | POA: Diagnosis not present

## 2022-05-08 DIAGNOSIS — Z114 Encounter for screening for human immunodeficiency virus [HIV]: Secondary | ICD-10-CM

## 2022-05-08 DIAGNOSIS — E039 Hypothyroidism, unspecified: Secondary | ICD-10-CM

## 2022-05-08 DIAGNOSIS — Z6841 Body Mass Index (BMI) 40.0 and over, adult: Secondary | ICD-10-CM

## 2022-05-08 DIAGNOSIS — I10 Essential (primary) hypertension: Secondary | ICD-10-CM

## 2022-05-08 DIAGNOSIS — R7301 Impaired fasting glucose: Secondary | ICD-10-CM

## 2022-05-08 NOTE — Patient Instructions (Signed)
I appreciate the opportunity to provide care to you today!    Follow up:  3 months  Labs: please stop by the lab today to get your blood drawn (CBC, CMP, TSH, Lipid profile, HgA1c, Vit D)  Screening: HIV and Hep C     Please continue to a heart-healthy diet and increase your physical activities. Try to exercise for 66mns at least three times a week.      It was a pleasure to see you and I look forward to continuing to work together on your health and well-being. Please do not hesitate to call the office if you need care or have questions about your care.   Have a wonderful day and week. With Gratitude, GAlvira MondayMSN, FNP-BC

## 2022-05-08 NOTE — Assessment & Plan Note (Signed)
Continue taking Norvasc 5 mg daily No changes to treatment regimen today Encourage heart healthy diet with increased physical activities BP Readings from Last 3 Encounters:  05/08/22 134/80  03/20/22 135/77  12/06/21 130/76

## 2022-05-08 NOTE — Assessment & Plan Note (Addendum)
Continue taking Synthroid 50 mcg daily on empty stomach No changes to treatment regiment We will get labs today to assess thyroid levels and overall health Lab Results  Component Value Date   TSH 3.350 09/18/2021

## 2022-05-08 NOTE — Assessment & Plan Note (Addendum)
Encouraged to continue heart healthy diet and increase her physical activities Encouraged to continue exercising 3 days a week  Wt Readings from Last 3 Encounters:  05/08/22 237 lb 1.9 oz (107.6 kg)  03/20/22 233 lb (105.7 kg)  12/06/21 235 lb (106.6 kg)

## 2022-05-08 NOTE — Progress Notes (Signed)
Established Patient Office Visit  Subjective:  Patient ID: Tracy Marks, female    DOB: 1976-01-08  Age: 46 y.o. MRN: [id number]  CC:  Chief Complaint  Patient presents with   Follow-up    5 month f/u for hypertension and hypothyroidism. No other complaints.     HPI Tracy Marks is a 46 y.o. female with past medical history of hypertension, hypothyroidism, and obesity presents for f/u of  chronic medical conditions.  Obesity: She reports lifestyle modifications with heart healthy diet and increasing her physical activities.  She reports decreasing her sugar intake and walking at least 3 days a week.  Hypertension: She takes Norvasc 5 mg daily.  She denies headaches, dizziness, shortness of breath, chest pain, palpitations, and chest tightness.  She reports compliance with treatment regimen.  Hypothyroidism: She takes Synthroid 50 mcg daily on empty stomach.  She denies cold intolerance, constipation, fatigue, muscle aches, fatigue, and depression.   Past Medical History:  Diagnosis Date   Contraceptive management 06/17/2013   Fibroids 08/26/2015   Heavy periods 07/01/2015   Hot flashes 07/01/2015   Hyperlipidemia    Hypertension    Obesity    Sinusitis    and seasonal allergies    Past Surgical History:  Procedure Laterality Date   CESAREAN SECTION  10/25/1994   CHOLECYSTECTOMY  12/2005   COLONOSCOPY N/A 12/22/2020   Procedure: COLONOSCOPY;  Surgeon: Rogene Houston, MD;  [facility]: [facility];  Service: Endoscopy;  Laterality: N/A;  am    Family History  Problem Relation Age of Onset   Heart disease Mother    Hypertension Mother    Diabetes Mother    Hyperlipidemia Father    Hypertension Father    Hypertension Maternal Grandmother    Arthritis Maternal Grandmother    Ulcers Maternal Grandfather    Breast cancer Neg Hx    Colon cancer Neg Hx     Social History   Socioeconomic History   Marital status: Married    Spouse name: Not on  file   Number of children: 1   Years of education: Not on file   Highest education level: Not on file  Occupational History   Not on file  Tobacco Use   Smoking status: Never   Smokeless tobacco: Never  Vaping Use   Vaping Use: Never used  Substance and Sexual Activity   Alcohol use: No   Drug use: No   Sexual activity: Yes    Birth control/protection: Pill  Other Topics Concern   Not on file  Social History Narrative   Lives with her spouse, works for [employer] Determinants of Health   Financial Resource Strain: Low Risk  (09/18/2021)   Overall Financial Resource Strain (CARDIA)    Difficulty of Paying Living Expenses: Not hard at all  Food Insecurity: No Food Insecurity (09/18/2021)   Hunger Vital Sign    Worried About Running Out of Food in the Last Year: Never true    [place] in the Last Year: Never true  Transportation Needs: No Transportation Needs (09/18/2021)   PRAPARE - Hydrologist (Medical): No    Lack of Transportation (Non-Medical): No  Physical Activity: Insufficiently Active (09/18/2021)   Exercise Vital Sign    Days of Exercise per Week: 2 days    Minutes of Exercise per Session: 30 min  Stress: No Stress Concern Present (09/18/2021)   [place]  Stress Questionnaire    Feeling of Stress : Not at all  Social Connections: Moderately Integrated (09/18/2021)   Social Connection and Isolation Panel [NHANES]    Frequency of Communication with Friends and Family: More than three times a week    Frequency of Social Gatherings with Friends and Family: Once a week    Attends Religious Services: 1 to 4 times per year    Active Member of [employer] or Organizations: No    Attends Archivist Meetings: Never    Marital Status: Married  Human resources officer Violence: Not At Risk (09/18/2021)   Humiliation, Afraid, Rape, and Kick questionnaire    Fear of Current or Ex-Partner: No     Emotionally Abused: No    Physically Abused: No    Sexually Abused: No    Outpatient Medications Prior to Visit  Medication Sig Dispense Refill   amLODipine (NORVASC) 5 MG tablet Take 1 tablet (5 mg total) by mouth daily. 30 tablet 6   fexofenadine (ALLEGRA) 180 MG tablet Take 180 mg by mouth daily.     levothyroxine (SYNTHROID) 50 MCG tablet TAKE 1 TABLET BY MOUTH EVERY DAY BEFORE BREAKFAST 90 tablet 3   MERZEE 1-20 MG-MCG(24) CAPS TAKE 1 CAPSULE BY MOUTH EVERY DAY 84 capsule 4   mometasone (NASONEX) 50 MCG/ACT nasal spray Place 1 spray into the nose daily as needed (allergies).     Multiple Vitamins-Calcium (ONE-A-DAY WOMENS PO) Take 1 tablet by mouth every other day.     No facility-administered medications prior to visit.    No Known Allergies  ROS Review of Systems  Constitutional:  Negative for chills, fatigue and fever.  Eyes:  Negative for pain and visual disturbance.  Respiratory:  Negative for chest tightness and shortness of breath.   Cardiovascular:  Negative for chest pain and palpitations.  Gastrointestinal:  Negative for constipation.  Endocrine: Negative for cold intolerance.  Neurological:  Negative for dizziness, weakness and headaches.      Objective:    Physical Exam Constitutional:      Appearance: She is obese.  HENT:     Head: Normocephalic.  Cardiovascular:     Rate and Rhythm: Normal rate and regular rhythm.     Pulses: Normal pulses.     Heart sounds: Normal heart sounds.  Pulmonary:     Effort: Pulmonary effort is normal.     Breath sounds: Normal breath sounds.  Neurological:     Mental Status: She is alert.     BP 134/80   Pulse 91   Ht '5\' 4"'  (1.626 m)   Wt 237 lb 1.9 oz (107.6 kg)   SpO2 97%   BMI 40.70 kg/m  Wt Readings from Last 3 Encounters:  05/08/22 237 lb 1.9 oz (107.6 kg)  03/20/22 233 lb (105.7 kg)  12/06/21 235 lb (106.6 kg)    Lab Results  Component Value Date   TSH 3.350 09/18/2021   Lab Results  Component  Value Date   WBC 6.6 09/18/2021   HGB 12.0 09/18/2021   HCT 36.2 09/18/2021   MCV 95 09/18/2021   PLT 433 09/18/2021   Lab Results  Component Value Date   NA 137 09/18/2021   K 4.2 09/18/2021   CO2 21 09/18/2021   GLUCOSE 80 09/18/2021   BUN 13 09/18/2021   CREATININE 0.71 09/18/2021   BILITOT 0.4 09/18/2021   ALKPHOS 122 (H) 09/18/2021   AST 18 09/18/2021   ALT 8 09/18/2021   PROT 7.3 09/18/2021  ALBUMIN 4.1 09/18/2021   CALCIUM 8.9 09/18/2021   EGFR 105 09/18/2021   Lab Results  Component Value Date   CHOL 197 09/18/2021   Lab Results  Component Value Date   HDL 67 09/18/2021   Lab Results  Component Value Date   LDLCALC 116 (H) 09/18/2021   Lab Results  Component Value Date   TRIG 77 09/18/2021   Lab Results  Component Value Date   CHOLHDL 2.9 09/18/2021   Lab Results  Component Value Date   HGBA1C 5.2 12/06/2021      Assessment & Plan:   Problem List Items Addressed This Visit       Cardiovascular and Mediastinum   Hypertension    Continue taking Norvasc 5 mg daily No changes to treatment regimen today Encourage heart healthy diet with increased physical activities BP Readings from Last 3 Encounters:  05/08/22 134/80  03/20/22 135/77  12/06/21 130/76        Relevant Orders   CBC with Differential/Platelet   CMP14+EGFR     Endocrine   Hypothyroidism    Continue taking Synthroid 50 mcg daily on empty stomach No changes to treatment regiment We will get labs today to assess thyroid levels and overall health Lab Results  Component Value Date   TSH 3.350 09/18/2021          Relevant Orders   TSH + free T4     Other   Obesity - Primary    Encouraged to continue heart healthy diet and increase her physical activities Encouraged to continue exercising 3 days a week  Wt Readings from Last 3 Encounters:  05/08/22 237 lb 1.9 oz (107.6 kg)  03/20/22 233 lb (105.7 kg)  12/06/21 235 lb (106.6 kg)        Other Visit Diagnoses      Need for hepatitis C screening test       Relevant Orders   Hepatitis C Antibody   Encounter for screening for HIV       Relevant Orders   HIV antibody (with reflex)   IFG (impaired fasting glucose)       Relevant Orders   Lipid Profile   Hemoglobin A1C       No orders of the defined types were placed in this encounter.   Follow-up: Return in about 3 months (around 08/08/2022).    Alvira Monday, FNP

## 2022-05-09 ENCOUNTER — Encounter: Payer: Self-pay | Admitting: Family Medicine

## 2022-05-09 ENCOUNTER — Other Ambulatory Visit: Payer: Self-pay | Admitting: Family Medicine

## 2022-05-09 DIAGNOSIS — E611 Iron deficiency: Secondary | ICD-10-CM

## 2022-05-09 LAB — HEMOGLOBIN A1C
Est. average glucose Bld gHb Est-mCnc: 105 mg/dL
Hgb A1c MFr Bld: 5.3 % (ref 4.8–5.6)

## 2022-05-09 LAB — CBC WITH DIFFERENTIAL/PLATELET
Basophils Absolute: 0 10*3/uL (ref 0.0–0.2)
Basos: 1 %
EOS (ABSOLUTE): 0.1 10*3/uL (ref 0.0–0.4)
Eos: 1 %
Hematocrit: 28.9 % — ABNORMAL LOW (ref 34.0–46.6)
Hemoglobin: 9.1 g/dL — ABNORMAL LOW (ref 11.1–15.9)
Immature Grans (Abs): 0 10*3/uL (ref 0.0–0.1)
Immature Granulocytes: 0 %
Lymphocytes Absolute: 1.3 10*3/uL (ref 0.7–3.1)
Lymphs: 23 %
MCH: 29.3 pg (ref 26.6–33.0)
MCHC: 31.5 g/dL (ref 31.5–35.7)
MCV: 93 fL (ref 79–97)
Monocytes Absolute: 0.4 10*3/uL (ref 0.1–0.9)
Monocytes: 7 %
Neutrophils Absolute: 3.9 10*3/uL (ref 1.4–7.0)
Neutrophils: 68 %
Platelets: 541 10*3/uL — ABNORMAL HIGH (ref 150–450)
RBC: 3.11 x10E6/uL — ABNORMAL LOW (ref 3.77–5.28)
RDW: 13.5 % (ref 11.7–15.4)
WBC: 5.7 10*3/uL (ref 3.4–10.8)

## 2022-05-09 LAB — CMP14+EGFR
ALT: 12 IU/L (ref 0–32)
AST: 17 IU/L (ref 0–40)
Albumin/Globulin Ratio: 1.3 (ref 1.2–2.2)
Albumin: 3.8 g/dL — ABNORMAL LOW (ref 3.9–4.9)
Alkaline Phosphatase: 135 IU/L — ABNORMAL HIGH (ref 44–121)
BUN/Creatinine Ratio: 15 (ref 9–23)
BUN: 10 mg/dL (ref 6–24)
Bilirubin Total: 0.4 mg/dL (ref 0.0–1.2)
CO2: 19 mmol/L — ABNORMAL LOW (ref 20–29)
Calcium: 8.6 mg/dL — ABNORMAL LOW (ref 8.7–10.2)
Chloride: 106 mmol/L (ref 96–106)
Creatinine, Ser: 0.67 mg/dL (ref 0.57–1.00)
Globulin, Total: 2.9 g/dL (ref 1.5–4.5)
Glucose: 79 mg/dL (ref 70–99)
Potassium: 4.7 mmol/L (ref 3.5–5.2)
Sodium: 139 mmol/L (ref 134–144)
Total Protein: 6.7 g/dL (ref 6.0–8.5)
eGFR: 108 mL/min/{1.73_m2} (ref >59)

## 2022-05-09 LAB — HIV ANTIBODY (ROUTINE TESTING W REFLEX): HIV Screen 4th Generation wRfx: NONREACTIVE

## 2022-05-09 LAB — LIPID PANEL
Chol/HDL Ratio: 3 ratio (ref 0.0–4.4)
Cholesterol, Total: 186 mg/dL (ref 100–199)
HDL: 63 mg/dL (ref >39)
LDL Chol Calc (NIH): 109 mg/dL — ABNORMAL HIGH (ref 0–99)
Triglycerides: 76 mg/dL (ref 0–149)
VLDL Cholesterol Cal: 14 mg/dL (ref 5–40)

## 2022-05-09 LAB — TSH+FREE T4
Free T4: 1.12 ng/dL (ref 0.82–1.77)
TSH: 3.38 u[IU]/mL (ref 0.450–4.500)

## 2022-05-09 LAB — HEPATITIS C ANTIBODY: Hep C Virus Ab: NONREACTIVE

## 2022-05-09 MED ORDER — IRON (FERROUS SULFATE) 325 (65 FE) MG PO TABS: ORAL_TABLET | ORAL | 3 refills | Status: DC

## 2022-05-09 NOTE — Progress Notes (Signed)
Reviewed labs with patient.  She noted to having heavy menstrual cycles, which has contributed to her low RBC, hemoglobin, and hematocrit.  Will start patient on iron supplement and reassess her labs in 3 months.  Patient verbalized understanding.

## 2022-05-21 DIAGNOSIS — D259 Leiomyoma of uterus, unspecified: Secondary | ICD-10-CM

## 2022-05-21 DIAGNOSIS — N926 Irregular menstruation, unspecified: Secondary | ICD-10-CM

## 2022-05-21 NOTE — Telephone Encounter (Signed)
Period heavy one day, clotting about 3 days no pain. Irregular bleeding, has not missed any pills.Marland Kitchen HGB has dropped 9.1. has known fibroids. Will get pelvic US 05/26/22 at 10 am at [facility]

## 2022-05-26 ENCOUNTER — Ambulatory Visit (HOSPITAL_BASED_OUTPATIENT_CLINIC_OR_DEPARTMENT_OTHER): Payer: No Typology Code available for payment source

## 2022-08-09 ENCOUNTER — Encounter: Payer: Self-pay | Admitting: Family Medicine

## 2022-08-09 ENCOUNTER — Ambulatory Visit (INDEPENDENT_AMBULATORY_CARE_PROVIDER_SITE_OTHER): Payer: No Typology Code available for payment source | Admitting: Family Medicine

## 2022-08-09 VITALS — BP 136/83 | HR 87 | Ht 64.0 in | Wt 234.1 lb

## 2022-08-09 DIAGNOSIS — E039 Hypothyroidism, unspecified: Secondary | ICD-10-CM

## 2022-08-09 DIAGNOSIS — Z23 Encounter for immunization: Secondary | ICD-10-CM

## 2022-08-09 DIAGNOSIS — Z6841 Body Mass Index (BMI) 40.0 and over, adult: Secondary | ICD-10-CM

## 2022-08-09 DIAGNOSIS — E559 Vitamin D deficiency, unspecified: Secondary | ICD-10-CM

## 2022-08-09 DIAGNOSIS — E611 Iron deficiency: Secondary | ICD-10-CM | POA: Insufficient documentation

## 2022-08-09 DIAGNOSIS — R7301 Impaired fasting glucose: Secondary | ICD-10-CM

## 2022-08-09 DIAGNOSIS — I10 Essential (primary) hypertension: Secondary | ICD-10-CM | POA: Diagnosis not present

## 2022-08-09 DIAGNOSIS — E7849 Other hyperlipidemia: Secondary | ICD-10-CM

## 2022-08-09 NOTE — Assessment & Plan Note (Signed)
She reports not taking her iron supplement due to side effects She reports increasing her intake of iron rich foods Will assess CBC today

## 2022-08-09 NOTE — Progress Notes (Signed)
Established Patient Office Visit  Subjective:  Patient ID: Tracy Marks, female    DOB: 1976/06/04  Age: 47 y.o. MRN: [id number]  CC:  Chief Complaint  Patient presents with   Follow-up    3 month f/u, pt lost 3 lbs since last visit. Would like to discuss iron supplement options, she bought otc iron made her feel nauseated and dizzy.     HPI Tracy Marks is a 47 y.o. female with past medical history of hypertension, hypothyroidism, and iron deficiency anemia presents for f/u of  chronic medical conditions. For the details of today's visit, please refer to the assessment and plan.     Past Medical History:  Diagnosis Date   Contraceptive management 06/17/2013   Fibroids 08/26/2015   Heavy periods 07/01/2015   Hot flashes 07/01/2015   Hyperlipidemia    Hypertension    Obesity    Sinusitis    and seasonal allergies    Past Surgical History:  Procedure Laterality Date   CESAREAN SECTION  10/25/1994   CHOLECYSTECTOMY  12/2005   COLONOSCOPY N/A 12/22/2020   Procedure: COLONOSCOPY;  Surgeon: Rogene Houston, MD;  [facility];  Service: Endoscopy;  Laterality: N/A;  am    Family History  Problem Relation Age of Onset   Heart disease Mother    Hypertension Mother    Diabetes Mother    Hyperlipidemia Father    Hypertension Father    Hypertension Maternal Grandmother    Arthritis Maternal Grandmother    Ulcers Maternal Grandfather    Breast cancer Neg Hx    Colon cancer Neg Hx     Social History   Socioeconomic History   Marital status: Married    Spouse name: Not on file   Number of children: 1   Years of education: Not on file   Highest education level: Not on file  Occupational History   Not on file  Tobacco Use   Smoking status: Never   Smokeless tobacco: Never  Vaping Use   Vaping Use: Never used  Substance and Sexual Activity   Alcohol use: No   Drug use: No   Sexual activity: Yes    Birth control/protection: Pill   Other Topics Concern   Not on file  Social History Narrative   Lives with her spouse, works for [employer] Determinants of Health   Financial Resource Strain: Low Risk  (09/18/2021)   Overall Financial Resource Strain (CARDIA)    Difficulty of Paying Living Expenses: Not hard at all  Food Insecurity: No Food Insecurity (09/18/2021)   Hunger Vital Sign    Worried About Running Out of Food in the Last Year: Never true    [place] in the Last Year: Never true  Transportation Needs: No Transportation Needs (09/18/2021)   PRAPARE - Hydrologist (Medical): No    Lack of Transportation (Non-Medical): No  Physical Activity: Insufficiently Active (09/18/2021)   Exercise Vital Sign    Days of Exercise per Week: 2 days    Minutes of Exercise per Session: 30 min  Stress: No Stress Concern Present (09/18/2021)   [place]    Feeling of Stress : Not at all  Social Connections: Moderately Integrated (09/18/2021)   Social Connection and Isolation Panel [NHANES]    Frequency of Communication with Friends and Family: More than three times a week    Frequency of Social  Gatherings with Friends and Family: Once a week    Attends Religious Services: 1 to 4 times per year    Active Member of [employer] or Organizations: No    Attends Archivist Meetings: Never    Marital Status: Married  Human resources officer Violence: Not At Risk (09/18/2021)   Humiliation, Afraid, Rape, and Kick questionnaire    Fear of Current or Ex-Partner: No    Emotionally Abused: No    Physically Abused: No    Sexually Abused: No    Outpatient Medications Prior to Visit  Medication Sig Dispense Refill   amLODipine (NORVASC) 5 MG tablet Take 1 tablet (5 mg total) by mouth daily. 30 tablet 6   fexofenadine (ALLEGRA) 180 MG tablet Take 180 mg by mouth daily.     Iron, Ferrous Sulfate, 325 (65 Fe) MG TABS Take 1 (one) tablet  once every other day or on Monday, Wednesday, and Friday 30 tablet 3   levothyroxine (SYNTHROID) 50 MCG tablet TAKE 1 TABLET BY MOUTH EVERY DAY BEFORE BREAKFAST 90 tablet 3   MERZEE 1-20 MG-MCG(24) CAPS TAKE 1 CAPSULE BY MOUTH EVERY DAY 84 capsule 4   mometasone (NASONEX) 50 MCG/ACT nasal spray Place 1 spray into the nose daily as needed (allergies).     Multiple Vitamins-Calcium (ONE-A-DAY WOMENS PO) Take 1 tablet by mouth every other day.     No facility-administered medications prior to visit.    No Known Allergies  ROS Review of Systems  Constitutional:  Negative for chills and fever.  Eyes:  Negative for visual disturbance.  Respiratory:  Negative for chest tightness and shortness of breath.   Neurological:  Negative for dizziness and headaches.      Objective:    Physical Exam HENT:     Head: Normocephalic.     Mouth/Throat:     Mouth: Mucous membranes are moist.  Cardiovascular:     Rate and Rhythm: Normal rate.     Heart sounds: Normal heart sounds.  Pulmonary:     Effort: Pulmonary effort is normal.     Breath sounds: Normal breath sounds.  Neurological:     Mental Status: She is alert.     BP 136/83   Pulse 87   Ht '5\' 4"'$  (1.626 m)   Wt 234 lb 1.9 oz (106.2 kg)   SpO2 98%   BMI 40.19 kg/m  Wt Readings from Last 3 Encounters:  08/09/22 234 lb 1.9 oz (106.2 kg)  05/08/22 237 lb 1.9 oz (107.6 kg)  03/20/22 233 lb (105.7 kg)    Lab Results  Component Value Date   TSH 3.380 05/08/2022   Lab Results  Component Value Date   WBC 5.7 05/08/2022   HGB 9.1 (L) 05/08/2022   HCT 28.9 (L) 05/08/2022   MCV 93 05/08/2022   PLT 541 (H) 05/08/2022   Lab Results  Component Value Date   NA 139 05/08/2022   K 4.7 05/08/2022   CO2 19 (L) 05/08/2022   GLUCOSE 79 05/08/2022   BUN 10 05/08/2022   CREATININE 0.67 05/08/2022   BILITOT 0.4 05/08/2022   ALKPHOS 135 (H) 05/08/2022   AST 17 05/08/2022   ALT 12 05/08/2022   PROT 6.7 05/08/2022   ALBUMIN 3.8 (L)  05/08/2022   CALCIUM 8.6 (L) 05/08/2022   EGFR 108 05/08/2022   Lab Results  Component Value Date   CHOL 186 05/08/2022   Lab Results  Component Value Date   HDL 63 05/08/2022   Lab Results  Component Value Date   LDLCALC 109 (H) 05/08/2022   Lab Results  Component Value Date   TRIG 76 05/08/2022   Lab Results  Component Value Date   CHOLHDL 3.0 05/08/2022   Lab Results  Component Value Date   HGBA1C 5.3 05/08/2022      Assessment & Plan:  Hypertension, unspecified type Assessment & Plan: Controlled Patient is asymptomatic She takes amlodipine 5 mg daily Encouraged to continue on therapy With a CBC and CMP today BP Readings from Last 3 Encounters:  08/09/22 136/83  05/08/22 134/80  03/20/22 135/77     Orders: -     CMP14+EGFR -     CBC with Differential/Platelet  Hypothyroidism, unspecified type Assessment & Plan: She takes Synthroid 50 mcg daily No complaints or concerns voiced Will assess thyroid panel today  Orders: -     TSH + free T4  Iron deficiency Assessment & Plan: She reports not taking her iron supplement due to side effects She reports increasing her intake of iron rich foods Will assess CBC today   Class 3 severe obesity due to excess calories without serious comorbidity with body mass index (BMI) of 40.0 to 44.9 in adult [facility])  Immunization due -     Tdap vaccine greater than or equal to 7yo IM  Vitamin D deficiency -     VITAMIN D 25 Hydroxy (Vit-D Deficiency, Fractures)  IFG (impaired fasting glucose) -     Hemoglobin A1c  Other hyperlipidemia -     Lipid panel    Follow-up: Return in about 4 months (around 12/08/2022).   Alvira Monday, FNP

## 2022-08-09 NOTE — Assessment & Plan Note (Signed)
She takes Synthroid 50 mcg daily No complaints or concerns voiced Will assess thyroid panel today

## 2022-08-09 NOTE — Patient Instructions (Signed)
I appreciate the opportunity to provide care to you today!    Follow up:  4 months  Labs: please stop by the lab today to get your blood drawn (CBC, CMP, TSH, Lipid profile, HgA1c, Vit D)      Please continue to a heart-healthy diet and increase your physical activities. Try to exercise for 82mns at least five times a week.      It was a pleasure to see you and I look forward to continuing to work together on your health and well-being. Please do not hesitate to call the office if you need care or have questions about your care.   Have a wonderful day and week. With Gratitude, GAlvira MondayMSN, FNP-BC

## 2022-08-09 NOTE — Assessment & Plan Note (Signed)
Controlled Patient is asymptomatic She takes amlodipine 5 mg daily Encouraged to continue on therapy With a CBC and CMP today BP Readings from Last 3 Encounters:  08/09/22 136/83  05/08/22 134/80  03/20/22 135/77

## 2022-08-10 ENCOUNTER — Encounter: Payer: Self-pay | Admitting: Family Medicine

## 2022-08-10 LAB — CBC WITH DIFFERENTIAL/PLATELET
Basophils Absolute: 0 10*3/uL (ref 0.0–0.2)
Basos: 1 %
EOS (ABSOLUTE): 0.1 10*3/uL (ref 0.0–0.4)
Eos: 1 %
Hematocrit: 30.5 % — ABNORMAL LOW (ref 34.0–46.6)
Hemoglobin: 9.4 g/dL — ABNORMAL LOW (ref 11.1–15.9)
Immature Grans (Abs): 0 10*3/uL (ref 0.0–0.1)
Immature Granulocytes: 0 %
Lymphocytes Absolute: 1.3 10*3/uL (ref 0.7–3.1)
Lymphs: 21 %
MCH: 27.5 pg (ref 26.6–33.0)
MCHC: 30.8 g/dL — ABNORMAL LOW (ref 31.5–35.7)
MCV: 89 fL (ref 79–97)
Monocytes Absolute: 0.5 10*3/uL (ref 0.1–0.9)
Monocytes: 7 %
Neutrophils Absolute: 4.3 10*3/uL (ref 1.4–7.0)
Neutrophils: 70 %
Platelets: 446 10*3/uL (ref 150–450)
RBC: 3.42 x10E6/uL — ABNORMAL LOW (ref 3.77–5.28)
RDW: 14.5 % (ref 11.7–15.4)
WBC: 6.1 10*3/uL (ref 3.4–10.8)

## 2022-08-10 LAB — CMP14+EGFR
ALT: 9 IU/L (ref 0–32)
AST: 11 IU/L (ref 0–40)
Albumin/Globulin Ratio: 1.2 (ref 1.2–2.2)
Albumin: 3.8 g/dL — ABNORMAL LOW (ref 3.9–4.9)
Alkaline Phosphatase: 119 IU/L (ref 44–121)
BUN/Creatinine Ratio: 16 (ref 9–23)
BUN: 11 mg/dL (ref 6–24)
Bilirubin Total: 0.2 mg/dL (ref 0.0–1.2)
CO2: 19 mmol/L — ABNORMAL LOW (ref 20–29)
Calcium: 9 mg/dL (ref 8.7–10.2)
Chloride: 105 mmol/L (ref 96–106)
Creatinine, Ser: 0.7 mg/dL (ref 0.57–1.00)
Globulin, Total: 3.1 g/dL (ref 1.5–4.5)
Glucose: 79 mg/dL (ref 70–99)
Potassium: 4.6 mmol/L (ref 3.5–5.2)
Sodium: 139 mmol/L (ref 134–144)
Total Protein: 6.9 g/dL (ref 6.0–8.5)
eGFR: 107 mL/min/{1.73_m2} (ref >59)

## 2022-08-10 LAB — LIPID PANEL
Chol/HDL Ratio: 2.7 ratio (ref 0.0–4.4)
Cholesterol, Total: 178 mg/dL (ref 100–199)
HDL: 67 mg/dL (ref >39)
LDL Chol Calc (NIH): 98 mg/dL (ref 0–99)
Triglycerides: 67 mg/dL (ref 0–149)
VLDL Cholesterol Cal: 13 mg/dL (ref 5–40)

## 2022-08-10 LAB — VITAMIN D 25 HYDROXY (VIT D DEFICIENCY, FRACTURES): Vit D, 25-Hydroxy: 44.9 ng/mL (ref 30.0–100.0)

## 2022-08-10 LAB — TSH+FREE T4
Free T4: 1.09 ng/dL (ref 0.82–1.77)
TSH: 3.66 u[IU]/mL (ref 0.450–4.500)

## 2022-08-10 LAB — HEMOGLOBIN A1C
Est. average glucose Bld gHb Est-mCnc: 105 mg/dL
Hgb A1c MFr Bld: 5.3 % (ref 4.8–5.6)

## 2022-08-28 ENCOUNTER — Other Ambulatory Visit: Payer: Self-pay | Admitting: Adult Health

## 2022-09-02 ENCOUNTER — Other Ambulatory Visit: Payer: Self-pay | Admitting: Adult Health

## 2022-09-19 ENCOUNTER — Ambulatory Visit: Payer: No Typology Code available for payment source | Admitting: Adult Health

## 2022-09-19 ENCOUNTER — Encounter: Payer: Self-pay | Admitting: Adult Health

## 2022-09-19 VITALS — BP 130/86 | HR 84 | Ht 64.0 in | Wt 240.0 lb

## 2022-09-19 DIAGNOSIS — I10 Essential (primary) hypertension: Secondary | ICD-10-CM | POA: Diagnosis not present

## 2022-09-19 DIAGNOSIS — Z1211 Encounter for screening for malignant neoplasm of colon: Secondary | ICD-10-CM

## 2022-09-19 DIAGNOSIS — Z01419 Encounter for gynecological examination (general) (routine) without abnormal findings: Secondary | ICD-10-CM | POA: Diagnosis not present

## 2022-09-19 DIAGNOSIS — Z3041 Encounter for surveillance of contraceptive pills: Secondary | ICD-10-CM | POA: Diagnosis not present

## 2022-09-19 DIAGNOSIS — E039 Hypothyroidism, unspecified: Secondary | ICD-10-CM | POA: Diagnosis not present

## 2022-09-19 LAB — HEMOCCULT GUIAC POC 1CARD (OFFICE): Fecal Occult Blood, POC: NEGATIVE

## 2022-09-19 MED ORDER — NORETHIN ACE-ETH ESTRAD-FE 1-20 MG-MCG(24) PO CAPS: 1.0000 | ORAL_CAPSULE | Freq: Every day | ORAL | 4 refills | Status: DC

## 2022-09-19 MED ORDER — LEVOTHYROXINE SODIUM 50 MCG PO TABS: ORAL_TABLET | ORAL | 3 refills | Status: DC

## 2022-09-19 MED ORDER — AMLODIPINE BESYLATE 5 MG PO TABS: 5.0000 mg | ORAL_TABLET | Freq: Every day | ORAL | 3 refills | Status: DC

## 2022-09-19 NOTE — Progress Notes (Addendum)
Patient ID: LIYAT POKORNEY, female   DOB: 10/24/1975, 47 y.o.   MRN: [id number] History of Present Illness: Norissa is a 47 year old black female,married, G1P1001 in for a well woman gyn exam.  Last pap was negative HPV, NILM, 09/01/22.   Current Medications, Allergies, Past Medical History, Past Surgical History, Family History and Social History were reviewed in [employer] record.     Review of Systems: Patient denies any headaches, hearing loss, fatigue, blurred vision, shortness of breath, chest pain, abdominal pain, problems with bowel movements, urination, or intercourse. No joint pain or mood swings.     Physical Exam:BP 130/86 (BP Location: Right Arm, Patient Position: Sitting, Cuff Size: Normal)   Pulse 84   Ht 5' 4"$  (1.626 m)   Wt 240 lb (108.9 kg)   LMP 09/01/2022 (Exact Date)   BMI 41.20 kg/m   General:  Well developed, well nourished, no acute distress Skin:  Warm and dry Neck:  Midline trachea, normal thyroid, good ROM, no lymphadenopathy Lungs; Clear to auscultation bilaterally Breast:  No dominant palpable mass, retraction, or nipple discharge Cardiovascular: Regular rate and rhythm Abdomen:  Soft, non tender, no hepatosplenomegaly Pelvic:  External genitalia is normal in appearance, no lesions.  The vagina is normal in appearance. Urethra has no lesions or masses. The cervix is smooth.  Uterus is felt to be normal size, shape, and contour.  No adnexal masses or tenderness noted.Bladder is non tender, no masses felt. Rectal: Good sphincter tone, no polyps, or hemorrhoids felt.  Hemoccult negative. Extremities/musculoskeletal:  No swelling or varicosities noted, no clubbing or cyanosis Psych:  No mood changes, alert and cooperative,seems happy AA is 0 Fall risk is low    09/19/2022    8:32 AM 08/09/2022    8:12 AM 05/08/2022    8:37 AM  Depression screen PHQ 2/9  Decreased Interest 0 0 0  Down, Depressed, Hopeless 0 0 0  PHQ - 2  Score 0 0 0  Altered sleeping 0 0   Tired, decreased energy 0 0   Change in appetite 0 0   Feeling bad or failure about yourself  0 0   Trouble concentrating 0 0   Moving slowly or fidgety/restless 0 0   Suicidal thoughts 0 0   PHQ-9 Score 0 0   Difficult doing work/chores  Not difficult at all        09/19/2022    8:33 AM 08/09/2022    8:12 AM 09/18/2021    8:36 AM 09/15/2020    9:30 AM  GAD 7 : Generalized Anxiety Score  Nervous, Anxious, on Edge 0 0 0 0  Control/stop worrying 0 0 0 0  Worry too much - different things 0 0 0 0  Trouble relaxing 0 0 0 0  Restless 0 0 0 0  Easily annoyed or irritable 0 0 0 0  Afraid - awful might happen 0 0 0 0  Total GAD 7 Score 0 0 0 0  Anxiety Difficulty  Not difficult at all        Upstream - 09/19/22 [id number]       Pregnancy Intention Screening   Does the patient want to become pregnant in the next year? No    Does the patient's partner want to become pregnant in the next year? No    Would the patient like to discuss contraceptive options today? No      Contraception Wrap Up   Current Method Oral Contraceptive  End Method Oral Contraceptive    Contraception Counseling Provided No            Examination chaperoned by Alvera Singh NP student    Impression and Plan: 1. Encounter for well woman exam with routine gynecological exam Pap and physical in 1 year Labs with PCP Mammogram was negative 03/19/22 Colonoscopy was 2022, repeat per GI Hemoccult was negative   2. Encounter for surveillance of contraceptive pills Periods better 2-3 days, will refill Merzee  3. Hypertension, unspecified type Will refill Norvasc 5 mg   4. Hypothyroidism, unspecified type TSH and free T4 normal in January Will refill synthroid 50 mcg   Meds ordered this encounter  Medications   amLODipine (NORVASC) 5 MG tablet    Sig: Take 1 tablet (5 mg total) by mouth daily.    Dispense:  90 tablet    Refill:  3    Order Specific Question:    Supervising Provider    Answer:   Tania Ade H [[id number]]   levothyroxine (SYNTHROID) 50 MCG tablet    Sig: Take 1 tablet daily before breakfast    Dispense:  90 tablet    Refill:  3    Order Specific Question:   Supervising Provider    Answer:   Elonda Husky, LUTHER H [[id number]]   Norethin Ace-Eth Estrad-FE (MERZEE) 1-20 MG-MCG(24) CAPS    Sig: Take 1 capsule by mouth daily.    Dispense:  84 capsule    Refill:  4    Order Specific Question:   Supervising Provider    Answer:   Tania Ade H [[id number]]

## 2022-12-10 ENCOUNTER — Ambulatory Visit (INDEPENDENT_AMBULATORY_CARE_PROVIDER_SITE_OTHER): Payer: No Typology Code available for payment source | Admitting: Family Medicine

## 2022-12-10 VITALS — BP 126/80 | HR 75 | Ht 64.0 in | Wt 238.4 lb

## 2022-12-10 DIAGNOSIS — E559 Vitamin D deficiency, unspecified: Secondary | ICD-10-CM

## 2022-12-10 DIAGNOSIS — E038 Other specified hypothyroidism: Secondary | ICD-10-CM

## 2022-12-10 DIAGNOSIS — R7301 Impaired fasting glucose: Secondary | ICD-10-CM

## 2022-12-10 DIAGNOSIS — N951 Menopausal and female climacteric states: Secondary | ICD-10-CM

## 2022-12-10 DIAGNOSIS — I10 Essential (primary) hypertension: Secondary | ICD-10-CM

## 2022-12-10 DIAGNOSIS — Z6841 Body Mass Index (BMI) 40.0 and over, adult: Secondary | ICD-10-CM

## 2022-12-10 DIAGNOSIS — E7849 Other hyperlipidemia: Secondary | ICD-10-CM

## 2022-12-10 NOTE — Progress Notes (Signed)
Established Patient Office Visit  Subjective:  Patient ID: Tracy Marks, female    DOB: January 07, 1976  Age: 47 y.o. MRN: [id number]  CC:  Chief Complaint  Patient presents with   Chronic Care Management    4 month f/u, pt would like to discuss weight concerns. Pt would also like to ask about female viagra.     HPI Tracy Marks is a 47 y.o. female with past medical history of hypothyroidism, and hypertension presents for f/u of  chronic medical conditions. For the details of today's visit, please refer to the assessment and plan.     Past Medical History:  Diagnosis Date   Contraceptive management 06/17/2013   Fibroids 08/26/2015   Heavy periods 07/01/2015   Hot flashes 07/01/2015   Hyperlipidemia    Hypertension    Obesity    Sinusitis    and seasonal allergies    Past Surgical History:  Procedure Laterality Date   CESAREAN SECTION  10/25/1994   CHOLECYSTECTOMY  12/2005   COLONOSCOPY N/A 12/22/2020   Procedure: COLONOSCOPY;  Surgeon: Malissa Hippo, MD;  Location: [facility];  Service: Endoscopy;  Laterality: N/A;  am    Family History  Problem Relation Age of Onset   Heart disease Mother    Hypertension Mother    Diabetes Mother    Hyperlipidemia Father    Hypertension Father    Hypertension Maternal Grandmother    Arthritis Maternal Grandmother    Ulcers Maternal Grandfather    Breast cancer Neg Hx    Colon cancer Neg Hx     Social History   Socioeconomic History   Marital status: Married    Spouse name: Not on file   Number of children: 1   Years of education: Not on file   Highest education level: Some college, no degree  Occupational History   Not on file  Tobacco Use   Smoking status: Never   Smokeless tobacco: Never  Vaping Use   Vaping Use: Never used  Substance and Sexual Activity   Alcohol use: No   Drug use: No   Sexual activity: Yes    Birth control/protection: Pill  Other Topics Concern   Not on file  Social  History Narrative   Lives with her spouse, works for [employer]   Social Determinants of Health   Financial Resource Strain: Low Risk  (12/10/2022)   Overall Financial Resource Strain (CARDIA)    Difficulty of Paying Living Expenses: Not hard at all  Food Insecurity: No Food Insecurity (12/10/2022)   Hunger Vital Sign    Worried About Running Out of Food in the Last Year: Never true    Ran Out of Food in the Last Year: Never true  Transportation Needs: No Transportation Needs (12/10/2022)   PRAPARE - Administrator, Civil Service (Medical): No    Lack of Transportation (Non-Medical): No  Physical Activity: Insufficiently Active (12/10/2022)   Exercise Vital Sign    Days of Exercise per Week: 3 days    Minutes of Exercise per Session: 30 min  Stress: No Stress Concern Present (12/10/2022)   [employer] of Occupational Health - Occupational Stress Questionnaire    Feeling of Stress : Not at all  Social Connections: Moderately Integrated (12/10/2022)   Social Connection and Isolation Panel [NHANES]    Frequency of Communication with Friends and Family: More than three times a week    Frequency of Social Gatherings with Friends and Family: Once a week  Attends Religious Services: 1 to 4 times per year    Active Member of Clubs or Organizations: No    Attends Banker Meetings: Never    Marital Status: Married  Catering manager Violence: Not At Risk (09/19/2022)   Humiliation, Afraid, Rape, and Kick questionnaire    Fear of Current or Ex-Partner: No    Emotionally Abused: No    Physically Abused: No    Sexually Abused: No    Outpatient Medications Prior to Visit  Medication Sig Dispense Refill   amLODipine (NORVASC) 5 MG tablet Take 1 tablet (5 mg total) by mouth daily. 90 tablet 3   fexofenadine (ALLEGRA) 180 MG tablet Take 180 mg by mouth daily.     levothyroxine (SYNTHROID) 50 MCG tablet Take 1 tablet daily before breakfast 90 tablet 3   mometasone  (NASONEX) 50 MCG/ACT nasal spray Place 1 spray into the nose daily as needed (allergies).     Multiple Vitamins-Calcium (ONE-A-DAY WOMENS PO) Take 1 tablet by mouth every other day.     Norethin Ace-Eth Estrad-FE (MERZEE) 1-20 MG-MCG(24) CAPS Take 1 capsule by mouth daily. 84 capsule 4   Iron, Ferrous Sulfate, 325 (65 Fe) MG TABS Take 1 (one) tablet once every other day or on Monday, Wednesday, and Friday (Patient not taking: Reported on 12/10/2022) 30 tablet 3   No facility-administered medications prior to visit.    No Known Allergies  ROS Review of Systems  Constitutional:  Negative for chills and fever.  Eyes:  Negative for visual disturbance.  Respiratory:  Negative for chest tightness and shortness of breath.   Neurological:  Negative for dizziness and headaches.      Objective:    Physical Exam HENT:     Head: Normocephalic.     Mouth/Throat:     Mouth: Mucous membranes are moist.  Cardiovascular:     Rate and Rhythm: Normal rate.     Heart sounds: Normal heart sounds.  Pulmonary:     Effort: Pulmonary effort is normal.     Breath sounds: Normal breath sounds.  Neurological:     Mental Status: She is alert.     BP 126/80   Pulse 75   Ht 5\' 4"  (1.626 m)   Wt 238 lb 6.4 oz (108.1 kg)   SpO2 99%   BMI 40.92 kg/m  Wt Readings from Last 3 Encounters:  12/10/22 238 lb 6.4 oz (108.1 kg)  09/19/22 240 lb (108.9 kg)  08/09/22 234 lb 1.9 oz (106.2 kg)    Lab Results  Component Value Date   TSH 3.660 08/09/2022   Lab Results  Component Value Date   WBC 6.1 08/09/2022   HGB 9.4 (L) 08/09/2022   HCT 30.5 (L) 08/09/2022   MCV 89 08/09/2022   PLT 446 08/09/2022   Lab Results  Component Value Date   NA 139 08/09/2022   K 4.6 08/09/2022   CO2 19 (L) 08/09/2022   GLUCOSE 79 08/09/2022   BUN 11 08/09/2022   CREATININE 0.70 08/09/2022   BILITOT 0.2 08/09/2022   ALKPHOS 119 08/09/2022   AST 11 08/09/2022   ALT 9 08/09/2022   PROT 6.9 08/09/2022   ALBUMIN  3.8 (L) 08/09/2022   CALCIUM 9.0 08/09/2022   EGFR 107 08/09/2022   Lab Results  Component Value Date   CHOL 178 08/09/2022   Lab Results  Component Value Date   HDL 67 08/09/2022   Lab Results  Component Value Date   LDLCALC 98 08/09/2022  Lab Results  Component Value Date   TRIG 67 08/09/2022   Lab Results  Component Value Date   CHOLHDL 2.7 08/09/2022   Lab Results  Component Value Date   HGBA1C 5.3 08/09/2022      Assessment & Plan:  Class 3 severe obesity due to excess calories with serious comorbidity and body mass index (BMI) of 40.0 to 44.9 in adult [facility]) Assessment & Plan: Reports inability to keep her weight off She reports increased physical activity but admits that she has not been adherent to a heart healthy diet Encourage the patient to implement lifestyle changes with a heart healthy diet and increase physical activity My weight management plan reviewed with the patient Encouraged to decrease her portion sizes when she eats Patient verbalized understanding Wt Readings from Last 3 Encounters:  12/10/22 238 lb 6.4 oz (108.1 kg)  09/19/22 240 lb (108.9 kg)  08/09/22 234 lb 1.9 oz (106.2 kg)      Peri-menopause Assessment & Plan: Reports decreased libido at times, stating that her husband asked her to inquire about female Viagra The patient is currently on combined oral contraceptive Will defer treatment for decreased libido to her gynecologist   IFG (impaired fasting glucose) -     Hemoglobin A1c  Vitamin D deficiency -     VITAMIN D 25 Hydroxy (Vit-D Deficiency, Fractures)  Other specified hypothyroidism Assessment & Plan: She takes Synthroid 50 mcg daily Complains of difficulty keeping her weight off She reports losing weight and then regaining her weight back Will assess her thyroid levels today Lab Results  Component Value Date   TSH 3.660 08/09/2022     Orders: -     TSH + free T4  Other hyperlipidemia -     Lipid panel -      CMP14+EGFR -     CBC with Differential/Platelet  Primary hypertension Assessment & Plan: Controlled Patient is asymptomatic She takes amlodipine 5 mg daily Encouraged to continue on therapy With a CBC and CMP today BP Readings from Last 3 Encounters:  12/10/22 126/80  09/19/22 130/86  08/09/22 136/83        Follow-up: Return in about 4 months (around 04/12/2023).   Gilmore Laroche, FNP

## 2022-12-10 NOTE — Assessment & Plan Note (Signed)
She takes Synthroid 50 mcg daily Complains of difficulty keeping her weight off She reports losing weight and then regaining her weight back Will assess her thyroid levels today Lab Results  Component Value Date   TSH 3.660 08/09/2022

## 2022-12-10 NOTE — Patient Instructions (Addendum)
I appreciate the opportunity to provide care to you today!    Follow up:  4 months  Labs: please stop by the lab today to get your blood drawn (CBC, CMP, TSH, Lipid profile, HgA1c, Vit D)   Healthy tips for weight loss  Eat three meals per day at times discussed. Cut out all diet bevergages and drink only water Eat whole food plant based meals Cut out junk food, fast food and processed foods Exercise 150 minutes a week Lose 1-2 lbs per week. Keep a food journal Choose foods that grow in a garden or in a fruit orchard and protein of animals with fins or feathers.  Lifestyle Medicine - Whole Food, Plant Predominant Nutrition is highly recommended: Eat Plenty of vegetables, Mushrooms, fruits, Legumes, Whole Grains, Nuts, seeds in lieu of processed meats, processed snacks/pastries red meat, poultry, eggs.  -It is better to avoid simple carbohydrates including: Cakes, Sweet Desserts, Ice Cream, Soda (diet and regular), Sweet Tea, Candies, Chips, Cookies, Store Bought Juices, Alcohol in Excess of  1-2 drinks a day, Lemonade,  Artificial Sweeteners, Doughnuts, Coffee Creamers, "Sugar-free" Products, etc, etc.  This is not a complete list.....  Exercise: If you are able: 30 -60 minutes a day ,4 days a week, or 150 minutes a week.  The longer the better.  Combine stretch, strength, and aerobic activities.  If you were told in the past that you have high risk for cardiovascular diseases, you may seek evaluation by your heart doctor prior to initiating moderate to intense exercise programs.      Please continue to a heart-healthy diet and increase your physical activities. Try to exercise for at least five days a week.      It was a pleasure to see you and I look forward to continuing to work together on your health and well-being. Please do not hesitate to call the office if you need care or have questions about your care.   Have a wonderful day and week. With Gratitude, Gilmore Laroche  MSN, FNP-BC

## 2022-12-10 NOTE — Assessment & Plan Note (Signed)
Reports decreased libido at times, stating that her husband asked her to inquire about female Viagra The patient is currently on combined oral contraceptive Will defer treatment for decreased libido to her gynecologist

## 2022-12-10 NOTE — Assessment & Plan Note (Signed)
Reports inability to keep her weight off She reports increased physical activity but admits that she has not been adherent to a heart healthy diet Encourage the patient to implement lifestyle changes with a heart healthy diet and increase physical activity My weight management plan reviewed with the patient Encouraged to decrease her portion sizes when she eats Patient verbalized understanding Wt Readings from Last 3 Encounters:  12/10/22 238 lb 6.4 oz (108.1 kg)  09/19/22 240 lb (108.9 kg)  08/09/22 234 lb 1.9 oz (106.2 kg)

## 2022-12-10 NOTE — Assessment & Plan Note (Signed)
Controlled Patient is asymptomatic She takes amlodipine 5 mg daily Encouraged to continue on therapy With a CBC and CMP today BP Readings from Last 3 Encounters:  12/10/22 126/80  09/19/22 130/86  08/09/22 136/83

## 2022-12-11 ENCOUNTER — Encounter: Payer: Self-pay | Admitting: Family Medicine

## 2022-12-11 LAB — CBC WITH DIFFERENTIAL/PLATELET
Basophils Absolute: 0 10*3/uL (ref 0.0–0.2)
Basos: 1 %
EOS (ABSOLUTE): 0.1 10*3/uL (ref 0.0–0.4)
Eos: 1 %
Hematocrit: 34.2 % (ref 34.0–46.6)
Hemoglobin: 11.2 g/dL (ref 11.1–15.9)
Immature Grans (Abs): 0 10*3/uL (ref 0.0–0.1)
Immature Granulocytes: 0 %
Lymphocytes Absolute: 1.4 10*3/uL (ref 0.7–3.1)
Lymphs: 23 %
MCH: 31.9 pg (ref 26.6–33.0)
MCHC: 32.7 g/dL (ref 31.5–35.7)
MCV: 97 fL (ref 79–97)
Monocytes Absolute: 0.5 10*3/uL (ref 0.1–0.9)
Monocytes: 8 %
Neutrophils Absolute: 4 10*3/uL (ref 1.4–7.0)
Neutrophils: 67 %
Platelets: 474 10*3/uL — ABNORMAL HIGH (ref 150–450)
RBC: 3.51 x10E6/uL — ABNORMAL LOW (ref 3.77–5.28)
RDW: 13.1 % (ref 11.7–15.4)
WBC: 6 10*3/uL (ref 3.4–10.8)

## 2022-12-11 LAB — CMP14+EGFR
ALT: 12 IU/L (ref 0–32)
AST: 14 IU/L (ref 0–40)
Albumin/Globulin Ratio: 1.3 (ref 1.2–2.2)
Albumin: 3.9 g/dL (ref 3.9–4.9)
Alkaline Phosphatase: 125 IU/L — ABNORMAL HIGH (ref 44–121)
BUN/Creatinine Ratio: 19 (ref 9–23)
BUN: 12 mg/dL (ref 6–24)
Bilirubin Total: 0.3 mg/dL (ref 0.0–1.2)
CO2: 21 mmol/L (ref 20–29)
Calcium: 9 mg/dL (ref 8.7–10.2)
Chloride: 104 mmol/L (ref 96–106)
Creatinine, Ser: 0.62 mg/dL (ref 0.57–1.00)
Globulin, Total: 2.9 g/dL (ref 1.5–4.5)
Glucose: 77 mg/dL (ref 70–99)
Potassium: 4.4 mmol/L (ref 3.5–5.2)
Sodium: 136 mmol/L (ref 134–144)
Total Protein: 6.8 g/dL (ref 6.0–8.5)
eGFR: 110 mL/min/{1.73_m2} (ref >59)

## 2022-12-11 LAB — HEMOGLOBIN A1C
Est. average glucose Bld gHb Est-mCnc: 100 mg/dL
Hgb A1c MFr Bld: 5.1 % (ref 4.8–5.6)

## 2022-12-11 LAB — LIPID PANEL
Chol/HDL Ratio: 2.9 ratio (ref 0.0–4.4)
Cholesterol, Total: 197 mg/dL (ref 100–199)
HDL: 67 mg/dL (ref >39)
LDL Chol Calc (NIH): 116 mg/dL — ABNORMAL HIGH (ref 0–99)
Triglycerides: 79 mg/dL (ref 0–149)
VLDL Cholesterol Cal: 14 mg/dL (ref 5–40)

## 2022-12-11 LAB — TSH+FREE T4
Free T4: 1.08 ng/dL (ref 0.82–1.77)
TSH: 3.91 u[IU]/mL (ref 0.450–4.500)

## 2022-12-11 LAB — VITAMIN D 25 HYDROXY (VIT D DEFICIENCY, FRACTURES): Vit D, 25-Hydroxy: 42.8 ng/mL (ref 30.0–100.0)

## 2023-02-01 ENCOUNTER — Telehealth: Payer: Self-pay | Admitting: *Deleted

## 2023-02-01 NOTE — Telephone Encounter (Signed)
My cycle came 22nd of last month, was on 3-4 days then went off It came back Sunday late evening. Has been on and off since.Patient was taking shower this morning and got light headed and hot. Husband called ems, They checked bp and everything was fine. Was told to follow up with gyn. Please advise.

## 2023-02-04 ENCOUNTER — Encounter: Payer: Self-pay | Admitting: Family Medicine

## 2023-02-04 NOTE — Telephone Encounter (Signed)
Received myhart message

## 2023-02-05 ENCOUNTER — Other Ambulatory Visit: Payer: Self-pay | Admitting: Family Medicine

## 2023-02-19 ENCOUNTER — Other Ambulatory Visit (HOSPITAL_COMMUNITY): Payer: Self-pay | Admitting: Adult Health

## 2023-02-19 DIAGNOSIS — Z1231 Encounter for screening mammogram for malignant neoplasm of breast: Secondary | ICD-10-CM

## 2023-03-25 ENCOUNTER — Ambulatory Visit (HOSPITAL_COMMUNITY)
Admission: RE | Admit: 2023-03-25 | Discharge: 2023-03-25 | Disposition: A | Discharge status: Home / Self Care | Payer: No Typology Code available for payment source | Source: Ambulatory Visit | Attending: Adult Health | Admitting: Adult Health

## 2023-03-25 DIAGNOSIS — Z1231 Encounter for screening mammogram for malignant neoplasm of breast: Secondary | ICD-10-CM | POA: Diagnosis present

## 2023-04-12 ENCOUNTER — Encounter: Payer: Self-pay | Admitting: Family Medicine

## 2023-04-12 ENCOUNTER — Ambulatory Visit (INDEPENDENT_AMBULATORY_CARE_PROVIDER_SITE_OTHER): Payer: No Typology Code available for payment source | Admitting: Family Medicine

## 2023-04-12 VITALS — BP 138/86 | HR 85 | Ht 64.0 in | Wt 234.1 lb

## 2023-04-12 DIAGNOSIS — E7849 Other hyperlipidemia: Secondary | ICD-10-CM

## 2023-04-12 DIAGNOSIS — I10 Essential (primary) hypertension: Secondary | ICD-10-CM | POA: Diagnosis not present

## 2023-04-12 DIAGNOSIS — E038 Other specified hypothyroidism: Secondary | ICD-10-CM | POA: Diagnosis not present

## 2023-04-12 DIAGNOSIS — R232 Flushing: Secondary | ICD-10-CM | POA: Diagnosis not present

## 2023-04-12 DIAGNOSIS — R7301 Impaired fasting glucose: Secondary | ICD-10-CM | POA: Diagnosis not present

## 2023-04-12 DIAGNOSIS — E559 Vitamin D deficiency, unspecified: Secondary | ICD-10-CM

## 2023-04-12 NOTE — Assessment & Plan Note (Signed)
Encouraged to continue taking Synthroid 50 mcg daily Pending thyroid levels Lab Results  Component Value Date   TSH 3.910 12/10/2022

## 2023-04-12 NOTE — Progress Notes (Signed)
Established Patient Office Visit  Subjective:  Patient ID: Tracy Marks, female    DOB: [birth year]  Age: 47 y.o. MRN: [id number]  CC:  Chief Complaint  Patient presents with   Care Management    Follow up appt    HPI CORLETTE Marks is a 47 y.o. female with past medical history of hypertension and hypothyroidism presents for f/u of  chronic medical conditions.  Hypertension: She takes amlodipine 5 mg daily.  She denies headaches, dizziness, blurred vision, chest pain, palpitation.  She reports compliance with treatment regiment.  Hypothyroidism: She takes Synthroid 50 mcg daily.  Denies symptoms of fatigue, unintentional weight gain, dry hair and skin.  Past Medical History:  Diagnosis Date   Contraceptive management 06/17/2013   Fibroids 08/26/2015   Heavy periods 07/01/2015   Hot flashes 07/01/2015   Hyperlipidemia    Hypertension    Obesity    Sinusitis    and seasonal allergies    Past Surgical History:  Procedure Laterality Date   CESAREAN SECTION  10/25/1994   CHOLECYSTECTOMY  12/2005   COLONOSCOPY N/A 12/22/2020   Procedure: COLONOSCOPY;  Surgeon: Malissa Hippo, MD;  Location: [facility];  Service: Endoscopy;  Laterality: N/A;  am    Family History  Problem Relation Age of Onset   Heart disease Mother    Hypertension Mother    Diabetes Mother    Hyperlipidemia Father    Hypertension Father    Hypertension Maternal Grandmother    Arthritis Maternal Grandmother    Ulcers Maternal Grandfather    Breast cancer Neg Hx    Colon cancer Neg Hx     Social History   Socioeconomic History   Marital status: Married    Spouse name: Not on file   Number of children: 1   Years of education: Not on file   Highest education level: Some college, no degree  Occupational History   Not on file  Tobacco Use   Smoking status: Never   Smokeless tobacco: Never  Vaping Use   Vaping status: Never Used  Substance and Sexual Activity   Alcohol  use: No   Drug use: No   Sexual activity: Yes    Birth control/protection: Pill  Other Topics Concern   Not on file  Social History Narrative   Lives with her spouse, works for [employer]   Social Determinants of Health   Financial Resource Strain: Low Risk  (12/10/2022)   Overall Financial Resource Strain (CARDIA)    Difficulty of Paying Living Expenses: Not hard at all  Food Insecurity: No Food Insecurity (12/10/2022)   Hunger Vital Sign    Worried About Running Out of Food in the Last Year: Never true    Ran Out of Food in the Last Year: Never true  Transportation Needs: No Transportation Needs (12/10/2022)   PRAPARE - Administrator, Civil Service (Medical): No    Lack of Transportation (Non-Medical): No  Physical Activity: Insufficiently Active (12/10/2022)   Exercise Vital Sign    Days of Exercise per Week: 3 days    Minutes of Exercise per Session: 30 min  Stress: No Stress Concern Present (12/10/2022)   [employer] of Occupational Health - Occupational Stress Questionnaire    Feeling of Stress : Not at all  Social Connections: Moderately Integrated (12/10/2022)   Social Connection and Isolation Panel [NHANES]    Frequency of Communication with Friends and Family: More than three times a week  Frequency of Social Gatherings with Friends and Family: Once a week    Attends Religious Services: 1 to 4 times per year    Active Member of [employer] or Organizations: No    Attends Banker Meetings: Never    Marital Status: Married  Catering manager Violence: Not At Risk (09/19/2022)   Humiliation, Afraid, Rape, and Kick questionnaire    Fear of Current or Ex-Partner: No    Emotionally Abused: No    Physically Abused: No    Sexually Abused: No    Outpatient Medications Prior to Visit  Medication Sig Dispense Refill   amLODipine (NORVASC) 5 MG tablet Take 1 tablet (5 mg total) by mouth daily. 90 tablet 3   fexofenadine (ALLEGRA) 180 MG tablet Take 180 mg  by mouth daily.     levothyroxine (SYNTHROID) 50 MCG tablet Take 1 tablet daily before breakfast 90 tablet 3   mometasone (NASONEX) 50 MCG/ACT nasal spray Place 1 spray into the nose daily as needed (allergies).     Multiple Vitamins-Calcium (ONE-A-DAY WOMENS PO) Take 1 tablet by mouth every other day.     Norethin Ace-Eth Estrad-FE (MERZEE) 1-20 MG-MCG(24) CAPS Take 1 capsule by mouth daily. 84 capsule 4   No facility-administered medications prior to visit.    No Known Allergies  ROS Review of Systems  Constitutional:  Negative for chills and fever.  Eyes:  Negative for visual disturbance.  Respiratory:  Negative for chest tightness and shortness of breath.   Neurological:  Negative for dizziness and headaches.      Objective:    Physical Exam HENT:     Head: Normocephalic.     Mouth/Throat:     Mouth: Mucous membranes are moist.  Cardiovascular:     Rate and Rhythm: Normal rate.     Heart sounds: Normal heart sounds.  Pulmonary:     Effort: Pulmonary effort is normal.     Breath sounds: Normal breath sounds.  Neurological:     Mental Status: She is alert.     BP 138/86   Pulse 85   Ht 5\' 4"  (1.626 m)   Wt 234 lb 1.9 oz (106.2 kg)   LMP 03/22/2023   SpO2 98%   BMI 40.19 kg/m  Wt Readings from Last 3 Encounters:  04/12/23 234 lb 1.9 oz (106.2 kg)  12/10/22 238 lb 6.4 oz (108.1 kg)  09/19/22 240 lb (108.9 kg)    Lab Results  Component Value Date   TSH 3.910 12/10/2022   Lab Results  Component Value Date   WBC 6.0 12/10/2022   HGB 11.2 12/10/2022   HCT 34.2 12/10/2022   MCV 97 12/10/2022   PLT 474 (H) 12/10/2022   Lab Results  Component Value Date   NA 136 12/10/2022   K 4.4 12/10/2022   CO2 21 12/10/2022   GLUCOSE 77 12/10/2022   BUN 12 12/10/2022   CREATININE 0.62 12/10/2022   BILITOT 0.3 12/10/2022   ALKPHOS 125 (H) 12/10/2022   AST 14 12/10/2022   ALT 12 12/10/2022   PROT 6.8 12/10/2022   ALBUMIN 3.9 12/10/2022   CALCIUM 9.0 12/10/2022    EGFR 110 12/10/2022   Lab Results  Component Value Date   CHOL 197 12/10/2022   Lab Results  Component Value Date   HDL 67 12/10/2022   Lab Results  Component Value Date   LDLCALC 116 (H) 12/10/2022   Lab Results  Component Value Date   TRIG 79 12/10/2022   Lab Results  Component Value Date   CHOLHDL 2.9 12/10/2022   Lab Results  Component Value Date   HGBA1C 5.1 12/10/2022      Assessment & Plan:  Primary hypertension Assessment & Plan: Controlled Encouraged low-sodium diet with increased physical activity Continue taking amlodipine 5 mg daily BP Readings from Last 3 Encounters:  04/12/23 138/86  12/10/22 126/80  09/19/22 130/86      Other specified hypothyroidism Assessment & Plan: Encouraged to continue taking Synthroid 50 mcg daily Pending thyroid levels Lab Results  Component Value Date   TSH 3.910 12/10/2022     Orders: -     TSH + free T4  Hot flashes Assessment & Plan: Perimenopausal Symptoms: I recommend taking over-the-counter Estroven menopause supplement for symptom relief.   IFG (impaired fasting glucose) -     Hemoglobin A1c  Vitamin D deficiency -     VITAMIN D 25 Hydroxy (Vit-D Deficiency, Fractures)  Other hyperlipidemia -     Lipid panel -     CMP14+EGFR -     CBC with Differential/Platelet  Note: This chart has been completed using Engineer, civil (consulting) software, and while attempts have been made to ensure accuracy, certain words and phrases may not be transcribed as intended.    Follow-up: Return in about 4 months (around 08/12/2023).   Gilmore Laroche, FNP

## 2023-04-12 NOTE — Assessment & Plan Note (Addendum)
Perimenopausal Symptoms: I recommend taking over-the-counter Estroven menopause supplement for symptom relief.

## 2023-04-12 NOTE — Assessment & Plan Note (Addendum)
Controlled Encouraged low-sodium diet with increased physical activity Continue taking amlodipine 5 mg daily BP Readings from Last 3 Encounters:  04/12/23 138/86  12/10/22 126/80  09/19/22 130/86

## 2023-04-12 NOTE — Patient Instructions (Addendum)
I appreciate the opportunity to provide care to you today!    Follow up:  4 months  Labs: please stop by the lab today to get your blood drawn (CBC, CMP, TSH, Lipid profile, HgA1c, Vit D)  Perimenopausal Symptoms: I recommend taking over-the-counter Estroven menopause supplement for symptom relief.  To help manage cholesterol levels, it's important to avoid or limit foods that are high in saturated fats, trans fats, and dietary cholesterol. Here are some foods to avoid or reduce in your diet:  [place] Foods:Deep-fried items such as french fries, fried chicken, and fried snacks are high in unhealthy fats and can raise LDL (bad) cholesterol levels. Processed Meats:Foods like bacon, sausage, hot dogs, and deli meats are often high in saturated fat and cholesterol. Full-Fat Dairy Products:Whole milk, full-fat yogurt, butter, cream, and cheese are rich in saturated fats, which can increase cholesterol levels. Baked Goods and Sweets:Pastries, cakes, cookies, and donuts often contain trans fats and added sugars, which can raise LDL cholesterol and lower HDL (good) cholesterol. Red Meat:Beef, lamb, and pork are high in saturated fat. Lean cuts or plant-based protein alternatives are better options. Lard and Shortening:Used in some baked goods, lard and shortening are high in trans fats and should be avoided. Fast Food:Many fast food items are cooked with unhealthy oils and contain high amounts of saturated and trans fats. Processed Snacks:Chips, crackers, and certain microwave popcorns can contain trans fats and high levels of unhealthy oils. Shellfish:While nutritious in other ways, some shellfish like shrimp, lobster, and crab are high in cholesterol. They should be consumed in moderation. Coconut and Palm Oils:these oils are high in saturated fat and can raise cholesterol levels when used in cooking or baking.   Attached with your AVS, you will find valuable resources for self-education. I highly  recommend dedicating some time to thoroughly examine them.   Please continue to a heart-healthy diet and increase your physical activities. Try to exercise for at least five days a week.    It was a pleasure to see you and I look forward to continuing to work together on your health and well-being. Please do not hesitate to call the office if you need care or have questions about your care.  In case of emergency, please visit the Emergency Department for urgent care, or contact our clinic at [phone] to schedule an appointment. We're here to help you!   Have a wonderful day and week. With Gratitude, Gilmore Laroche MSN, FNP-BC

## 2023-04-13 ENCOUNTER — Encounter (HOSPITAL_COMMUNITY): Payer: Self-pay | Admitting: *Deleted

## 2023-04-13 ENCOUNTER — Other Ambulatory Visit: Payer: Self-pay

## 2023-04-13 ENCOUNTER — Emergency Department (HOSPITAL_COMMUNITY)
Admission: EM | Admit: 2023-04-13 | Discharge: 2023-04-13 | Disposition: A | Discharge status: Home / Self Care | Payer: No Typology Code available for payment source | Attending: Emergency Medicine | Admitting: Emergency Medicine

## 2023-04-13 ENCOUNTER — Telehealth: Payer: Self-pay | Admitting: Family Medicine

## 2023-04-13 DIAGNOSIS — D649 Anemia, unspecified: Secondary | ICD-10-CM | POA: Insufficient documentation

## 2023-04-13 LAB — CBC WITH DIFFERENTIAL/PLATELET
Abs Immature Granulocytes: 0.03 10*3/uL (ref 0.00–0.07)
Basophils Absolute: 0 10*3/uL (ref 0.0–0.2)
Basophils Absolute: 0 10*3/uL (ref 0.0–0.1)
Basophils Relative: 1 %
Basos: 1 %
EOS (ABSOLUTE): 0 10*3/uL (ref 0.0–0.4)
Eos: 1 %
Eosinophils Absolute: 0 10*3/uL (ref 0.0–0.5)
Eosinophils Relative: 1 %
HCT: 23.7 % — ABNORMAL LOW (ref 36.0–46.0)
Hematocrit: 24.1 % — ABNORMAL LOW (ref 34.0–46.6)
Hemoglobin: 6.9 g/dL — CL (ref 11.1–15.9)
Hemoglobin: 6.9 g/dL — CL (ref 12.0–15.0)
Immature Grans (Abs): 0 10*3/uL (ref 0.0–0.1)
Immature Granulocytes: 0 %
Immature Granulocytes: 1 %
Lymphocytes Absolute: 1.1 10*3/uL (ref 0.7–3.1)
Lymphocytes Relative: 23 %
Lymphs Abs: 1.1 10*3/uL (ref 0.7–4.0)
Lymphs: 21 %
MCH: 23.9 pg — ABNORMAL LOW (ref 26.6–33.0)
MCH: 24.3 pg — ABNORMAL LOW (ref 26.0–34.0)
MCHC: 28.6 g/dL — ABNORMAL LOW (ref 31.5–35.7)
MCHC: 29.1 g/dL — ABNORMAL LOW (ref 30.0–36.0)
MCV: 83 fL (ref 79–97)
MCV: 83.5 fL (ref 80.0–100.0)
Monocytes Absolute: 0.4 10*3/uL (ref 0.1–0.9)
Monocytes Absolute: 0.4 10*3/uL (ref 0.1–1.0)
Monocytes Relative: 9 %
Monocytes: 7 %
Neutro Abs: 3.2 10*3/uL (ref 1.7–7.7)
Neutrophils Absolute: 3.6 10*3/uL (ref 1.4–7.0)
Neutrophils Relative %: 65 %
Neutrophils: 70 %
Platelets: 676 10*3/uL — ABNORMAL HIGH (ref 150–450)
Platelets: 596 10*3/uL — ABNORMAL HIGH (ref 150–400)
RBC: 2.89 x10E6/uL — ABNORMAL LOW (ref 3.77–5.28)
RBC: 2.84 MIL/uL — ABNORMAL LOW (ref 3.87–5.11)
RDW: 16.7 % — ABNORMAL HIGH (ref 11.7–15.4)
RDW: 19.2 % — ABNORMAL HIGH (ref 11.5–15.5)
WBC: 5.2 10*3/uL (ref 3.4–10.8)
WBC: 4.9 10*3/uL (ref 4.0–10.5)
nRBC: 0 % (ref 0.0–0.2)

## 2023-04-13 LAB — CMP14+EGFR
ALT: 15 IU/L (ref 0–32)
AST: 14 IU/L (ref 0–40)
Albumin: 3.8 g/dL — ABNORMAL LOW (ref 3.9–4.9)
Alkaline Phosphatase: 142 IU/L — ABNORMAL HIGH (ref 44–121)
BUN/Creatinine Ratio: 15 (ref 9–23)
BUN: 9 mg/dL (ref 6–24)
Bilirubin Total: 0.2 mg/dL (ref 0.0–1.2)
CO2: 19 mmol/L — ABNORMAL LOW (ref 20–29)
Calcium: 8.7 mg/dL (ref 8.7–10.2)
Chloride: 104 mmol/L (ref 96–106)
Creatinine, Ser: 0.6 mg/dL (ref 0.57–1.00)
Globulin, Total: 3.1 g/dL (ref 1.5–4.5)
Glucose: 76 mg/dL (ref 70–99)
Potassium: 4.5 mmol/L (ref 3.5–5.2)
Sodium: 137 mmol/L (ref 134–144)
Total Protein: 6.9 g/dL (ref 6.0–8.5)
eGFR: 110 mL/min/{1.73_m2} (ref >59)

## 2023-04-13 LAB — LIPID PANEL
Chol/HDL Ratio: 3 ratio (ref 0.0–4.4)
Cholesterol, Total: 196 mg/dL (ref 100–199)
HDL: 66 mg/dL (ref >39)
LDL Chol Calc (NIH): 116 mg/dL — ABNORMAL HIGH (ref 0–99)
Triglycerides: 79 mg/dL (ref 0–149)
VLDL Cholesterol Cal: 14 mg/dL (ref 5–40)

## 2023-04-13 LAB — VITAMIN B12: Vitamin B-12: 267 pg/mL (ref 180–914)

## 2023-04-13 LAB — IRON AND TIBC
Iron: 16 ug/dL — ABNORMAL LOW (ref 28–170)
Saturation Ratios: 3 % — ABNORMAL LOW (ref 10.4–31.8)
TIBC: 487 ug/dL — ABNORMAL HIGH (ref 250–450)
UIBC: 471 ug/dL

## 2023-04-13 LAB — HEMOGLOBIN A1C
Est. average glucose Bld gHb Est-mCnc: 97 mg/dL
Hgb A1c MFr Bld: 5 % (ref 4.8–5.6)

## 2023-04-13 LAB — RETICULOCYTES
Immature Retic Fract: 29 % — ABNORMAL HIGH (ref 2.3–15.9)
RBC.: 2.81 MIL/uL — ABNORMAL LOW (ref 3.87–5.11)
Retic Count, Absolute: 49.7 10*3/uL (ref 19.0–186.0)
Retic Ct Pct: 1.8 % (ref 0.4–3.1)

## 2023-04-13 LAB — FERRITIN: Ferritin: 4 ng/mL — ABNORMAL LOW (ref 11–307)

## 2023-04-13 LAB — BASIC METABOLIC PANEL
Anion gap: 9 (ref 5–15)
BUN: 11 mg/dL (ref 6–20)
CO2: 23 mmol/L (ref 22–32)
Calcium: 8.5 mg/dL — ABNORMAL LOW (ref 8.9–10.3)
Chloride: 106 mmol/L (ref 98–111)
Creatinine, Ser: 0.61 mg/dL (ref 0.44–1.00)
GFR, Estimated: >60 mL/min (ref >60)
Glucose, Bld: 88 mg/dL (ref 70–99)
Potassium: 3.9 mmol/L (ref 3.5–5.1)
Sodium: 138 mmol/L (ref 135–145)

## 2023-04-13 LAB — VITAMIN D 25 HYDROXY (VIT D DEFICIENCY, FRACTURES): Vit D, 25-Hydroxy: 41.7 ng/mL (ref 30.0–100.0)

## 2023-04-13 LAB — TSH+FREE T4
Free T4: 1.2 ng/dL (ref 0.82–1.77)
TSH: 3.46 u[IU]/mL (ref 0.450–4.500)

## 2023-04-13 LAB — POC OCCULT BLOOD, ED: Fecal Occult Bld: NEGATIVE

## 2023-04-13 LAB — FOLATE: Folate: 19.5 ng/mL (ref >5.9)

## 2023-04-13 NOTE — ED Notes (Signed)
ED Provider at bedside. 

## 2023-04-13 NOTE — Telephone Encounter (Addendum)
I called and left a detailed message advising the patient to go to the emergency department due to her hemoglobin level of 6.9.

## 2023-04-13 NOTE — ED Provider Notes (Signed)
[place] [facility]PARTMENT AT [facility] Provider Note   CSN: [id number] Arrival date & time: 04/13/23  8413     History  No chief complaint on file.   Tracy Marks is a 47 y.o. female.  She was sent in by her PCP for low hemoglobin level that was noted from a blood work done yesterday.  She said this was for routine appointment and patient had no complaints.  She does endorse heavy periods.  She is seen no other bleeding, no melena.  She denies any chest pain shortness of breath dizziness syncope.  She is not on a blood thinner.  Is not needed transfusions in the past.  The history is provided by the patient.       Home Medications Prior to Admission medications   Medication Sig Start Date End Date Taking? Authorizing Provider  amLODipine (NORVASC) 5 MG tablet Take 1 tablet (5 mg total) by mouth daily. 09/19/22   Adline Potter, NP  fexofenadine (ALLEGRA) 180 MG tablet Take 180 mg by mouth daily.    [provider]  levothyroxine (SYNTHROID) 50 MCG tablet Take 1 tablet daily before breakfast 09/19/22   Cyril Mourning A, NP  mometasone (NASONEX) 50 MCG/ACT nasal spray Place 1 spray into the nose daily as needed (allergies).    [provider]  Multiple Vitamins-Calcium (ONE-A-DAY WOMENS PO) Take 1 tablet by mouth every other day.    [provider]  Norethin Ace-Eth Estrad-FE (MERZEE) 1-20 MG-MCG(24) CAPS Take 1 capsule by mouth daily. 09/19/22   Adline Potter, NP      Allergies    Patient has no known allergies.    Review of Systems   Review of Systems  Constitutional:  Negative for fever.  HENT:  Negative for sore throat.   Respiratory:  Negative for shortness of breath.   Cardiovascular:  Negative for chest pain.  Gastrointestinal:  Negative for abdominal pain.  Genitourinary:  Negative for dysuria.  Neurological:  Negative for syncope.    Physical Exam Updated Vital Signs BP (!) 143/86   Pulse 98    Temp 98 F (36.7 C) (Oral)   Resp 20   Ht 5\' 4"  (1.626 m)   Wt 106.1 kg   LMP 04/12/2023   SpO2 100%   BMI 40.17 kg/m  Physical Exam Vitals and nursing note reviewed.  Constitutional:      General: She is not in acute distress.    Appearance: Normal appearance. She is well-developed.  HENT:     Head: Normocephalic and atraumatic.  Eyes:     Conjunctiva/sclera: Conjunctivae normal.  Cardiovascular:     Rate and Rhythm: Normal rate and regular rhythm.     Heart sounds: No murmur heard. Pulmonary:     Effort: Pulmonary effort is normal. No respiratory distress.     Breath sounds: Normal breath sounds.  Abdominal:     Palpations: Abdomen is soft.     Tenderness: There is no abdominal tenderness. There is no guarding or rebound.  Musculoskeletal:        General: No deformity.     Cervical back: Neck supple.  Skin:    General: Skin is warm and dry.     Capillary Refill: Capillary refill takes less than 2 seconds.  Neurological:     General: No focal deficit present.     Mental Status: She is alert.     ED Results / Procedures / Treatments   Labs (all labs ordered  are listed, but only abnormal results are displayed) Labs Reviewed  CBC WITH DIFFERENTIAL/PLATELET - Abnormal; Notable for the following components:      Result Value   RBC 2.84 (*)    Hemoglobin 6.9 (*)    HCT 23.7 (*)    MCH 24.3 (*)    MCHC 29.1 (*)    RDW 19.2 (*)    Platelets 596 (*)    All other components within normal limits  IRON AND TIBC - Abnormal; Notable for the following components:   Iron 16 (*)    TIBC 487 (*)    Saturation Ratios 3 (*)    All other components within normal limits  FERRITIN - Abnormal; Notable for the following components:   Ferritin 4 (*)    All other components within normal limits  RETICULOCYTES - Abnormal; Notable for the following components:   RBC. 2.81 (*)    Immature Retic Fract 29.0 (*)    All other components within normal limits  BASIC METABOLIC PANEL -  Abnormal; Notable for the following components:   Calcium 8.5 (*)    All other components within normal limits  VITAMIN B12  FOLATE  POC OCCULT BLOOD, ED    EKG None  Radiology No results found.  Procedures Procedures    Medications Ordered in ED Medications - No data to display  ED Course/ Medical Decision Making/ A&P Clinical Course as of 04/13/23 1806  Sat Apr 13, 2023  1017 Rectal exam done with tech as chaperone.  No masses no signs of gross bleeding.  Sample sent for guaiac. [MB]  1116 Patient's rectal exam is negative.  She has heavy menstrual bleeding monthly.  Hemoglobin is unchanged at 6.9.  She is not symptomatic otherwise.  We discussed transfusion versus her continuing to follow the trend with her PCP.  Her indices do not look overwhelmingly like iron deficiency.  Will have her take multivitamin and iron supplements.  Recommended close follow-up with PCP [MB]  1138 Had a shared decision-making discussion with patient and her spouse.  She would rather hold off getting a transfusion right now.  She will follow-up with her PCP.  She is taking a supplement but she will incorporate iron and leafy greens into her diet.  She understands to return if she becomes symptomatic. [MB]    Clinical Course User Index [MB] Terrilee Files, MD                                 Medical Decision Making Amount and/or Complexity of Data Reviewed Labs: ordered.   This patient complains of low hemoglobin; this involves an extensive number of treatment Options and is a complaint that carries with it a high risk of complications and morbidity. The differential includes symptomatic anemia, pancytopenia, GI bleed, dysfunctional uterine bleeding  I ordered, reviewed and interpreted labs, which included CBC with normal white count low hemoglobin elevated platelets, chemistries fairly unremarkable, anemia panel sent Additional history obtained from patient's husband Previous records  obtained and reviewed in epic including recent PCP note Cardiac monitoring reviewed, sinus rhythm Social determinants considered, no significant barriers Critical Interventions: None  After the interventions stated above, I reevaluated the patient and found patient to be hemodynamically stable and without any symptoms Admission and further testing considered, we discussed transfusion versus discharge with close follow-up with PCP and patient elects for discharge.  She will supplement with multivitamins and start an iron  supplement.  Return instructions discussed.         Final Clinical Impression(s) / ED Diagnoses Final diagnoses:  Anemia, unspecified type    Rx / DC Orders ED Discharge Orders     None         Terrilee Files, MD 04/13/23 [phone]

## 2023-04-13 NOTE — Discharge Instructions (Signed)
You were seen in the emergency department for low blood counts.  Your blood count was low at 6.9 but stable.  You are not having any symptoms.  We sent other blood studies to workup your anemia.  You declined a transfusion at this time.  Please continue multivitamin and also add an iron supplement.  Follow-up with your primary care doctor for further discussion about your blood work.  Return to the emergency department if you become symptomatic such as shortness of breath chest pain fainting spells dizziness.

## 2023-04-13 NOTE — ED Notes (Signed)
POC occult negative, MD aware

## 2023-04-13 NOTE — ED Triage Notes (Signed)
Pt went in yesterday to her PCP for her regular checkup with blood work. Pt was called today and told her Hemoglobin was 6.9 and to come to the ED for further evaluation. Pt denies weakness, fatigue, but does report some SOB.

## 2023-04-13 NOTE — ED Notes (Signed)
Pt in bathroom

## 2023-04-15 ENCOUNTER — Encounter: Payer: Self-pay | Admitting: Family Medicine

## 2023-04-17 IMAGING — MG MM DIGITAL SCREENING BILAT W/ TOMO AND CAD
8 series · 8 of 24 positions shown · non-contrast
Comparison: Previous exam(s).

CLINICAL DATA: Screening.

EXAM:
DIGITAL SCREENING BILATERAL MAMMOGRAM WITH TOMOSYNTHESIS AND CAD
TECHNIQUE: Bilateral screening digital craniocaudal and mediolateral oblique
mammograms were obtained. Bilateral screening digital breast
tomosynthesis was performed. The images were evaluated with
computer-aided detection.

[L MLO synth-2D]
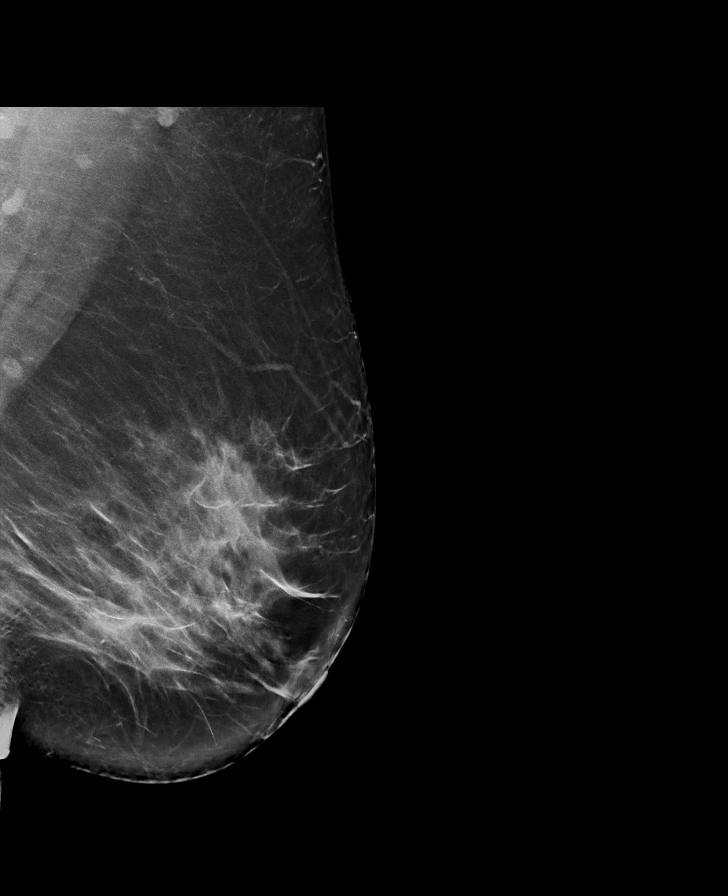

[L CC synth-2D]
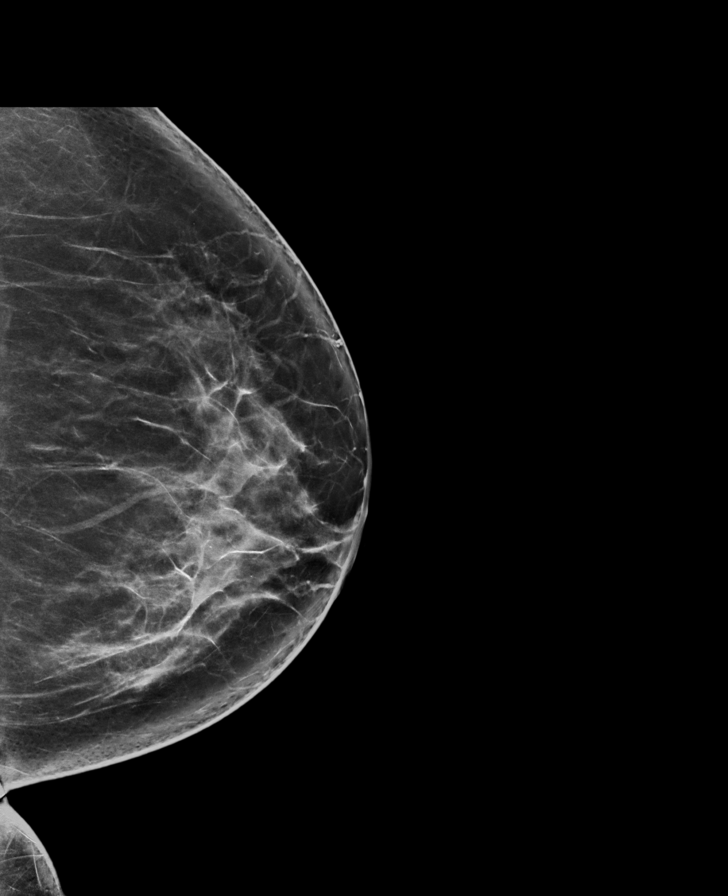

[R CC synth-2D]
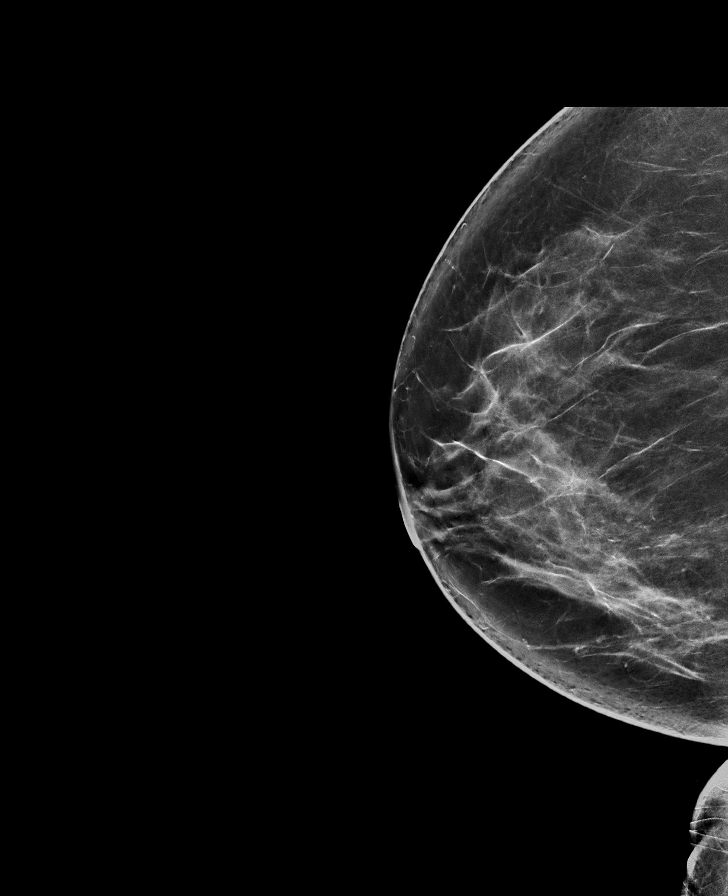

[R MLO synth-2D]
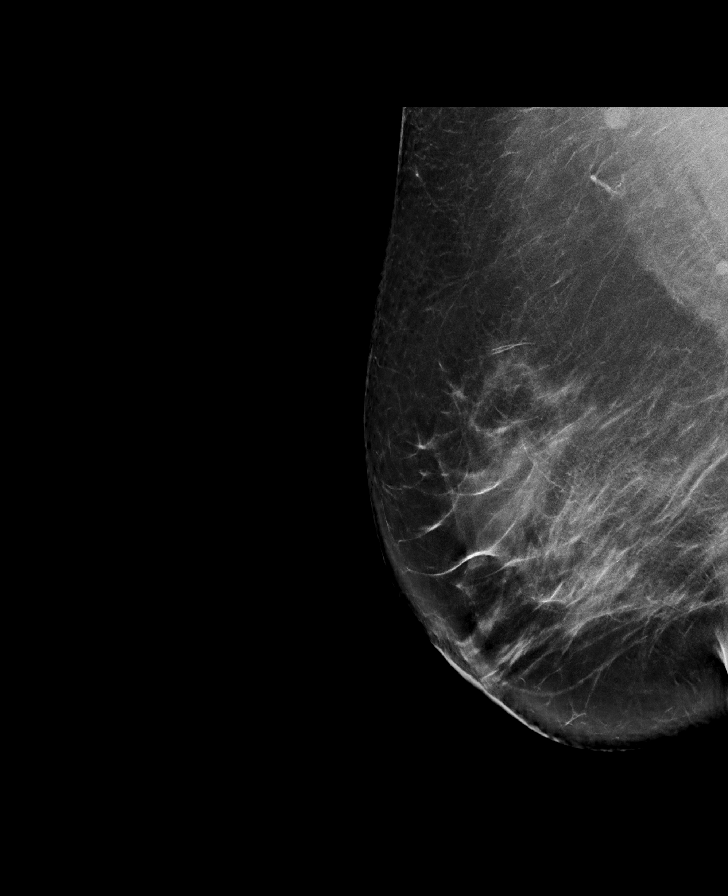

[L MLO tomo · tomo slice 49/97.0]
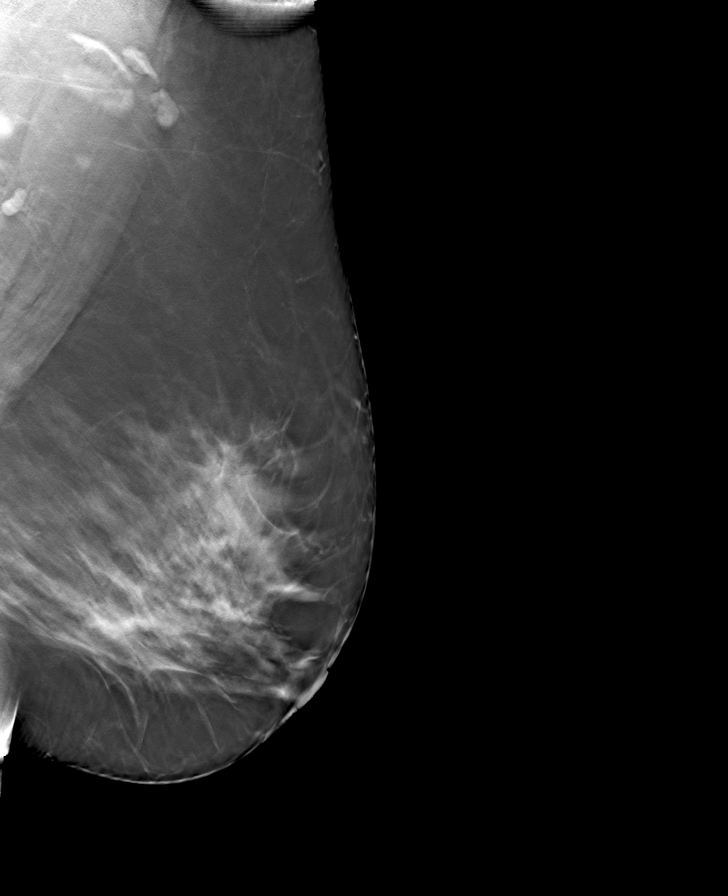

[L CC tomo · tomo slice 44/87.0]
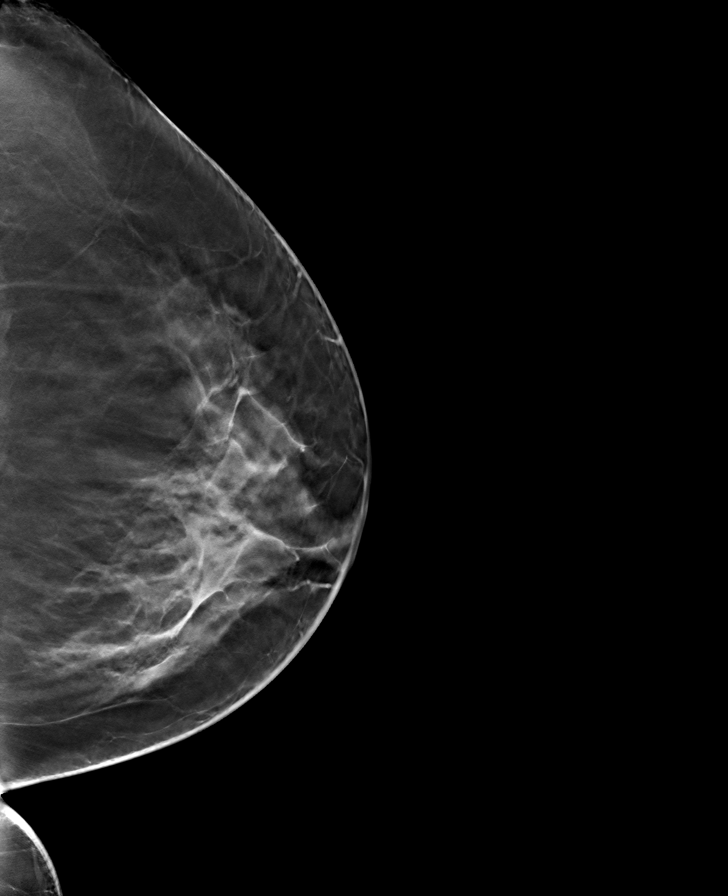

[R MLO tomo · tomo slice 49/98.0]
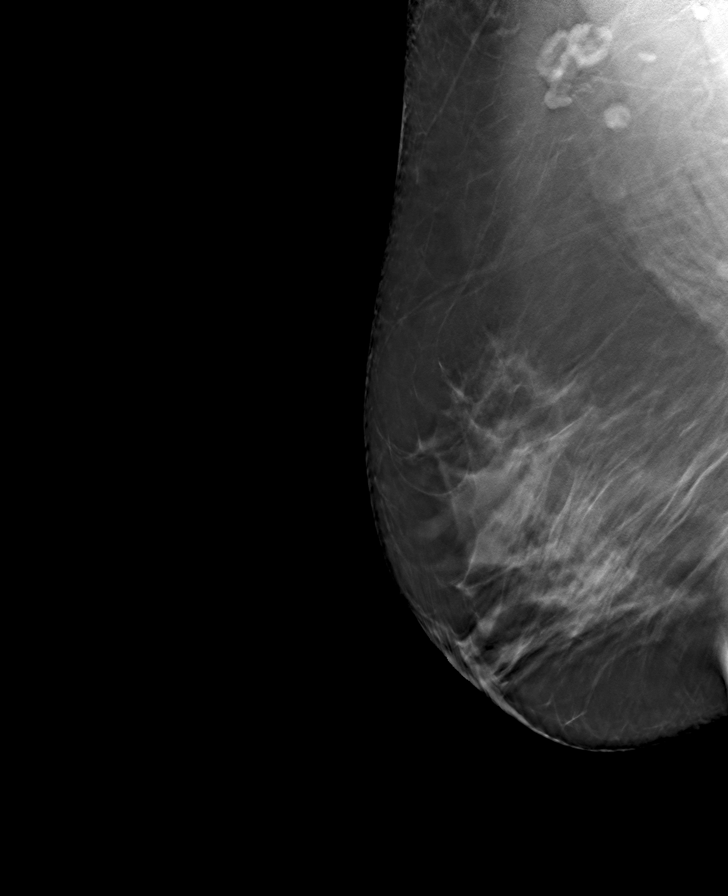

[R CC tomo · tomo slice 41/82.0]
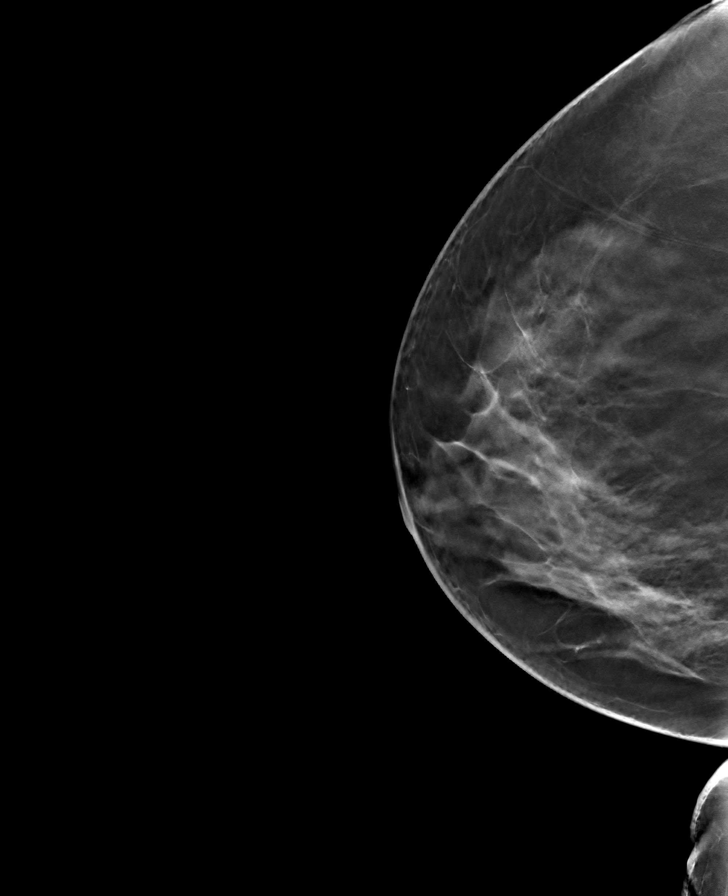

[8 of 24 positions shown; findings below may reference images not displayed]

ACR Breast Density Category b: There are scattered areas of
fibroglandular density.
FINDINGS: There are no findings suspicious for malignancy.
IMPRESSION: No mammographic evidence of malignancy. A result letter of this
screening mammogram will be mailed directly to the patient.

RECOMMENDATION:
Screening mammogram in one year. (Code:51-O-LD2)

BI-RADS CATEGORY  1: Negative.

## 2023-08-14 ENCOUNTER — Ambulatory Visit: Payer: No Typology Code available for payment source | Admitting: Family Medicine

## 2023-08-19 ENCOUNTER — Ambulatory Visit (INDEPENDENT_AMBULATORY_CARE_PROVIDER_SITE_OTHER): Payer: No Typology Code available for payment source | Admitting: Family Medicine

## 2023-08-19 VITALS — BP 122/77 | HR 79 | Ht 64.0 in | Wt 236.0 lb

## 2023-08-19 DIAGNOSIS — I10 Essential (primary) hypertension: Secondary | ICD-10-CM | POA: Diagnosis not present

## 2023-08-19 DIAGNOSIS — R7301 Impaired fasting glucose: Secondary | ICD-10-CM

## 2023-08-19 DIAGNOSIS — E038 Other specified hypothyroidism: Secondary | ICD-10-CM

## 2023-08-19 DIAGNOSIS — E7849 Other hyperlipidemia: Secondary | ICD-10-CM

## 2023-08-19 DIAGNOSIS — E559 Vitamin D deficiency, unspecified: Secondary | ICD-10-CM

## 2023-08-19 NOTE — Assessment & Plan Note (Addendum)
Controlled the patient reports compliance on amlodipine 5 mg daily and is currently asymptomatic in the clinic today A low-sodium diet of less than 2300 mg daily is recommended, along with increased physical activity of moderate intensity, aiming for 150 minutes weekly. The patient is encouraged to continue with these lifestyle modifications to help manage their blood pressure effectively.  BP Readings from Last 3 Encounters:  08/19/23 122/77  04/13/23 130/76  04/12/23 138/86

## 2023-08-19 NOTE — Progress Notes (Signed)
Established Patient Office Visit  Subjective:  Patient ID: Tracy Marks, female    DOB: [birth year]  Age: 48 y.o. MRN: [id number]  CC:  Chief Complaint  Patient presents with   Follow-up    4 mo HTN     HPI Tracy Marks is a 48 y.o. female with past medical history of hypertension, and hypothyroidism presents for f/u of  chronic medical conditions. For the details of today's visit, please refer to the assessment and plan.     Past Medical History:  Diagnosis Date   Contraceptive management 06/17/2013   Fibroids 08/26/2015   Heavy periods 07/01/2015   Hot flashes 07/01/2015   Hyperlipidemia    Hypertension    Obesity    Sinusitis    and seasonal allergies    Past Surgical History:  Procedure Laterality Date   CESAREAN SECTION  10/25/1994   CHOLECYSTECTOMY  12/2005   COLONOSCOPY N/A 12/22/2020   Procedure: COLONOSCOPY;  Surgeon: Malissa Hippo, MD;  Location: [facility];  Service: Endoscopy;  Laterality: N/A;  am    Family History  Problem Relation Age of Onset   Heart disease Mother    Hypertension Mother    Diabetes Mother    Hyperlipidemia Father    Hypertension Father    Hypertension Maternal Grandmother    Arthritis Maternal Grandmother    Ulcers Maternal Grandfather    Breast cancer Neg Hx    Colon cancer Neg Hx     Social History   Socioeconomic History   Marital status: Married    Spouse name: Not on file   Number of children: 1   Years of education: Not on file   Highest education level: Some college, no degree  Occupational History   Not on file  Tobacco Use   Smoking status: Never   Smokeless tobacco: Never  Vaping Use   Vaping status: Never Used  Substance and Sexual Activity   Alcohol use: No   Drug use: No   Sexual activity: Yes    Birth control/protection: Pill  Other Topics Concern   Not on file  Social History Narrative   Lives with her spouse, works for [employer]   Social Drivers of Health   Financial  Resource Strain: Low Risk  (08/19/2023)   Overall Financial Resource Strain (CARDIA)    Difficulty of Paying Living Expenses: Not hard at all  Food Insecurity: No Food Insecurity (08/19/2023)   Hunger Vital Sign    Worried About Running Out of Food in the Last Year: Never true    Ran Out of Food in the Last Year: Never true  Transportation Needs: No Transportation Needs (08/19/2023)   PRAPARE - Administrator, Civil Service (Medical): No    Lack of Transportation (Non-Medical): No  Physical Activity: Insufficiently Active (08/19/2023)   Exercise Vital Sign    Days of Exercise per Week: 3 days    Minutes of Exercise per Session: 30 min  Stress: No Stress Concern Present (08/19/2023)   [employer] of Occupational Health - Occupational Stress Questionnaire    Feeling of Stress : Not at all  Social Connections: Moderately Integrated (08/19/2023)   Social Connection and Isolation Panel [NHANES]    Frequency of Communication with Friends and Family: More than three times a week    Frequency of Social Gatherings with Friends and Family: Twice a week    Attends Religious Services: 1 to 4 times per year    Active Member of  Clubs or Organizations: No    Attends Banker Meetings: Never    Marital Status: Married  Catering manager Violence: Not At Risk (09/19/2022)   Humiliation, Afraid, Rape, and Kick questionnaire    Fear of Current or Ex-Partner: No    Emotionally Abused: No    Physically Abused: No    Sexually Abused: No    Outpatient Medications Prior to Visit  Medication Sig Dispense Refill   amLODipine (NORVASC) 5 MG tablet Take 1 tablet (5 mg total) by mouth daily. 90 tablet 3   Ferrous Sulfate (IRON PO) Take by mouth.     fexofenadine (ALLEGRA) 180 MG tablet Take 180 mg by mouth daily.     levothyroxine (SYNTHROID) 50 MCG tablet Take 1 tablet daily before breakfast 90 tablet 3   mometasone (NASONEX) 50 MCG/ACT nasal spray Place 1 spray into the nose daily  as needed (allergies).     Multiple Vitamins-Calcium (ONE-A-DAY WOMENS PO) Take 1 tablet by mouth every other day.     Norethin Ace-Eth Estrad-FE (MERZEE) 1-20 MG-MCG(24) CAPS Take 1 capsule by mouth daily. 84 capsule 4   No facility-administered medications prior to visit.    No Known Allergies  ROS Review of Systems  Constitutional:  Negative for chills and fever.  Eyes:  Negative for visual disturbance.  Respiratory:  Negative for chest tightness and shortness of breath.   Neurological:  Negative for dizziness and headaches.      Objective:    Physical Exam HENT:     Head: Normocephalic.     Mouth/Throat:     Mouth: Mucous membranes are moist.  Cardiovascular:     Rate and Rhythm: Normal rate.     Heart sounds: Normal heart sounds.  Pulmonary:     Effort: Pulmonary effort is normal.     Breath sounds: Normal breath sounds.  Neurological:     Mental Status: She is alert.     BP 122/77   Pulse 79   Ht 5\' 4"  (1.626 m)   Wt 236 lb (107 kg)   LMP 08/03/2023 (Exact Date)   SpO2 97%   BMI 40.51 kg/m  Wt Readings from Last 3 Encounters:  08/19/23 236 lb (107 kg)  04/13/23 234 lb (106.1 kg)  04/12/23 234 lb 1.9 oz (106.2 kg)    Lab Results  Component Value Date   TSH 3.460 04/12/2023   Lab Results  Component Value Date   WBC 4.9 04/13/2023   HGB 6.9 (LL) 04/13/2023   HCT 23.7 (L) 04/13/2023   MCV 83.5 04/13/2023   PLT 596 (H) 04/13/2023   Lab Results  Component Value Date   NA 138 04/13/2023   K 3.9 04/13/2023   CO2 23 04/13/2023   GLUCOSE 88 04/13/2023   BUN 11 04/13/2023   CREATININE 0.61 04/13/2023   BILITOT 0.2 04/12/2023   ALKPHOS 142 (H) 04/12/2023   AST 14 04/12/2023   ALT 15 04/12/2023   PROT 6.9 04/12/2023   ALBUMIN 3.8 (L) 04/12/2023   CALCIUM 8.5 (L) 04/13/2023   ANIONGAP 9 04/13/2023   EGFR 110 04/12/2023   Lab Results  Component Value Date   CHOL 196 04/12/2023   Lab Results  Component Value Date   HDL 66 04/12/2023   Lab  Results  Component Value Date   LDLCALC 116 (H) 04/12/2023   Lab Results  Component Value Date   TRIG 79 04/12/2023   Lab Results  Component Value Date   CHOLHDL 3.0 04/12/2023  Lab Results  Component Value Date   HGBA1C 5.0 04/12/2023      Assessment & Plan:  Primary hypertension Assessment & Plan: Controlled the patient reports compliance on amlodipine 5 mg daily and is currently asymptomatic in the clinic today A low-sodium diet of less than 2300 mg daily is recommended, along with increased physical activity of moderate intensity, aiming for 150 minutes weekly. The patient is encouraged to continue with these lifestyle modifications to help manage their blood pressure effectively.  BP Readings from Last 3 Encounters:  08/19/23 122/77  04/13/23 130/76  04/12/23 138/86      Other specified hypothyroidism Assessment & Plan: The patient takes Synthroid 50 MCG daily and reports treatment compliance The patient is encouraged to follow up if experiencing symptoms of hypothyroidism, despite compliance with the current treatment regimen, such as fatigue, weight gain, constipation, dry skin, cold intolerance, hair loss, or depression.  Lab Results  Component Value Date   TSH 3.460 04/12/2023    Lab Results  Component Value Date   TSH 3.460 04/12/2023      IFG (impaired fasting glucose) -     Hemoglobin A1c  Vitamin D deficiency -     VITAMIN D 25 Hydroxy (Vit-D Deficiency, Fractures)  TSH (thyroid-stimulating hormone deficiency) -     TSH + free T4  Other hyperlipidemia -     Lipid panel -     CMP14+EGFR -     CBC with Differential/Platelet  Note: This chart has been completed using Engineer, civil (consulting) software, and while attempts have been made to ensure accuracy, certain words and phrases may not be transcribed as intended.    Follow-up: Return in about 4 months (around 12/17/2023).   Gilmore Laroche, FNP

## 2023-08-19 NOTE — Assessment & Plan Note (Signed)
The patient takes Synthroid 50 MCG daily and reports treatment compliance The patient is encouraged to follow up if experiencing symptoms of hypothyroidism, despite compliance with the current treatment regimen, such as fatigue, weight gain, constipation, dry skin, cold intolerance, hair loss, or depression.  Lab Results  Component Value Date   TSH 3.460 04/12/2023    Lab Results  Component Value Date   TSH 3.460 04/12/2023

## 2023-08-19 NOTE — Patient Instructions (Signed)
I appreciate the opportunity to provide care to you today!    Follow up:  4 months  Labs: please stop by the lab today to get your blood drawn (CBC, CMP, TSH, Lipid profile, HgA1c, Vit D)   Attached with your AVS, you will find valuable resources for self-education. I highly recommend dedicating some time to thoroughly examine them.   Please continue to a heart-healthy diet and increase your physical activities. Try to exercise for 30mins at least five days a week.    It was a pleasure to see you and I look forward to continuing to work together on your health and well-being. Please do not hesitate to call the office if you need care or have questions about your care.  In case of emergency, please visit the Emergency Department for urgent care, or contact our clinic at [phone] to schedule an appointment. We're here to help you!   Have a wonderful day and week. With Gratitude, Kylan Veach MSN, FNP-BC  

## 2023-08-20 LAB — CBC WITH DIFFERENTIAL/PLATELET
Basophils Absolute: 0 10*3/uL (ref 0.0–0.2)
Basos: 1 %
EOS (ABSOLUTE): 0.1 10*3/uL (ref 0.0–0.4)
Eos: 1 %
Hematocrit: 30.5 % — ABNORMAL LOW (ref 34.0–46.6)
Hemoglobin: 9.4 g/dL — ABNORMAL LOW (ref 11.1–15.9)
Immature Grans (Abs): 0 10*3/uL (ref 0.0–0.1)
Immature Granulocytes: 0 %
Lymphocytes Absolute: 1.1 10*3/uL (ref 0.7–3.1)
Lymphs: 18 %
MCH: 30 pg (ref 26.6–33.0)
MCHC: 30.8 g/dL — ABNORMAL LOW (ref 31.5–35.7)
MCV: 97 fL (ref 79–97)
Monocytes Absolute: 0.5 10*3/uL (ref 0.1–0.9)
Monocytes: 9 %
Neutrophils Absolute: 4.3 10*3/uL (ref 1.4–7.0)
Neutrophils: 71 %
Platelets: 567 10*3/uL — ABNORMAL HIGH (ref 150–450)
RBC: 3.13 x10E6/uL — ABNORMAL LOW (ref 3.77–5.28)
RDW: 12.1 % (ref 11.7–15.4)
WBC: 6 10*3/uL (ref 3.4–10.8)

## 2023-08-20 LAB — VITAMIN D 25 HYDROXY (VIT D DEFICIENCY, FRACTURES): Vit D, 25-Hydroxy: 43.9 ng/mL (ref 30.0–100.0)

## 2023-08-20 LAB — LIPID PANEL
Chol/HDL Ratio: 2.9 {ratio} (ref 0.0–4.4)
Cholesterol, Total: 194 mg/dL (ref 100–199)
HDL: 67 mg/dL (ref >39)
LDL Chol Calc (NIH): 114 mg/dL — ABNORMAL HIGH (ref 0–99)
Triglycerides: 69 mg/dL (ref 0–149)
VLDL Cholesterol Cal: 13 mg/dL (ref 5–40)

## 2023-08-20 LAB — CMP14+EGFR
ALT: 14 [IU]/L (ref 0–32)
AST: 16 [IU]/L (ref 0–40)
Albumin: 3.9 g/dL (ref 3.9–4.9)
Alkaline Phosphatase: 124 [IU]/L — ABNORMAL HIGH (ref 44–121)
BUN/Creatinine Ratio: 19 (ref 9–23)
BUN: 13 mg/dL (ref 6–24)
Bilirubin Total: <0.2 mg/dL (ref 0.0–1.2)
CO2: 21 mmol/L (ref 20–29)
Calcium: 9.2 mg/dL (ref 8.7–10.2)
Chloride: 103 mmol/L (ref 96–106)
Creatinine, Ser: 0.68 mg/dL (ref 0.57–1.00)
Globulin, Total: 2.9 g/dL (ref 1.5–4.5)
Glucose: 74 mg/dL (ref 70–99)
Potassium: 4.9 mmol/L (ref 3.5–5.2)
Sodium: 138 mmol/L (ref 134–144)
Total Protein: 6.8 g/dL (ref 6.0–8.5)
eGFR: 107 mL/min/{1.73_m2} (ref >59)

## 2023-08-20 LAB — HEMOGLOBIN A1C
Est. average glucose Bld gHb Est-mCnc: 91 mg/dL
Hgb A1c MFr Bld: 4.8 % (ref 4.8–5.6)

## 2023-08-20 LAB — TSH+FREE T4
Free T4: 1.02 ng/dL (ref 0.82–1.77)
TSH: 4.03 u[IU]/mL (ref 0.450–4.500)

## 2023-08-22 ENCOUNTER — Encounter: Payer: Self-pay | Admitting: Family Medicine

## 2023-08-25 ENCOUNTER — Encounter: Payer: Self-pay | Admitting: Family Medicine

## 2023-08-25 ENCOUNTER — Other Ambulatory Visit: Payer: Self-pay | Admitting: Family Medicine

## 2023-09-24 ENCOUNTER — Other Ambulatory Visit (HOSPITAL_COMMUNITY)
Admission: RE | Admit: 2023-09-24 | Discharge: 2023-09-24 | Disposition: A | Discharge status: Home / Self Care | Payer: No Typology Code available for payment source | Source: Ambulatory Visit | Attending: Adult Health | Admitting: Adult Health

## 2023-09-24 ENCOUNTER — Encounter: Payer: Self-pay | Admitting: Adult Health

## 2023-09-24 ENCOUNTER — Ambulatory Visit: Payer: No Typology Code available for payment source | Admitting: Adult Health

## 2023-09-24 VITALS — BP 139/84 | HR 84 | Ht 64.0 in | Wt 238.0 lb

## 2023-09-24 DIAGNOSIS — I10 Essential (primary) hypertension: Secondary | ICD-10-CM

## 2023-09-24 DIAGNOSIS — Z01419 Encounter for gynecological examination (general) (routine) without abnormal findings: Secondary | ICD-10-CM

## 2023-09-24 DIAGNOSIS — R232 Flushing: Secondary | ICD-10-CM

## 2023-09-24 DIAGNOSIS — Z1211 Encounter for screening for malignant neoplasm of colon: Secondary | ICD-10-CM

## 2023-09-24 DIAGNOSIS — E039 Hypothyroidism, unspecified: Secondary | ICD-10-CM

## 2023-09-24 DIAGNOSIS — Z3041 Encounter for surveillance of contraceptive pills: Secondary | ICD-10-CM | POA: Diagnosis not present

## 2023-09-24 DIAGNOSIS — Z1331 Encounter for screening for depression: Secondary | ICD-10-CM

## 2023-09-24 LAB — HEMOCCULT GUIAC POC 1CARD (OFFICE): Fecal Occult Blood, POC: NEGATIVE

## 2023-09-24 MED ORDER — NORETHIN ACE-ETH ESTRAD-FE 1-20 MG-MCG(24) PO CAPS: 1.0000 | ORAL_CAPSULE | Freq: Every day | ORAL | 4 refills | Status: DC

## 2023-09-24 NOTE — Progress Notes (Signed)
 Patient ID: Tracy Marks, female   DOB: February 28, 1976, 48 y.o.   MRN: [id number] History of Present Illness: Tracy Marks is a 48 year old black female,married, G1P1001, in for a well woman gyn exam and pap.  PCP is Sharen Hones NP   Current Medications, Allergies, Past Medical History, Past Surgical History, Family History and Social History were reviewed in [name] record.     Review of Systems: Patient denies any headaches, hearing loss, fatigue, blurred vision, shortness of breath, chest pain, abdominal pain, problems with bowel movements, urination, or intercourse. No joint pain or mood swings.  Has hot flashes and night sweats at times and periods can be light to heavy, she is still on BCP   Physical Exam:BP 139/84 (BP Location: Left Arm, Patient Position: Sitting, Cuff Size: Normal)   Pulse 84   Ht 5\' 4"  (1.626 m)   Wt 238 lb (108 kg)   LMP 08/31/2023 (Exact Date)   BMI 40.85 kg/m   General:  Well developed, well nourished, no acute distress Skin:  Warm and dry Neck:  Midline trachea, normal thyroid, good ROM, no lymphadenopathy Lungs; Clear to auscultation bilaterally Breast:  No dominant palpable mass, retraction, or nipple discharge Cardiovascular: Regular rate and rhythm Abdomen:  Soft, non tender, no hepatosplenomegaly Pelvic:  External genitalia is normal in appearance, no lesions.  The vagina is normal in appearance. Urethra has no lesions or masses. The cervix is smooth, pap with HR HPV genotyping performed.  Uterus is felt to be normal size, shape, and contour.  No adnexal masses or tenderness noted.Bladder is non tender, no masses felt. Rectal: Good sphincter tone, no polyps, or hemorrhoids felt.  Hemoccult negative. Extremities/musculoskeletal:  No swelling or varicosities noted, no clubbing or cyanosis Psych:  No mood changes, alert and cooperative,seems happy AA is 0    09/24/2023    8:34 AM 04/12/2023    8:05 AM 12/10/2022    8:09 AM   Depression screen PHQ 2/9  Decreased Interest 0 0 0  Down, Depressed, Hopeless 0 0 0  PHQ - 2 Score 0 0 0  Altered sleeping 0 0 0  Tired, decreased energy 0 0 0  Change in appetite 0 0 0  Feeling bad or failure about yourself  0 0 0  Trouble concentrating 0 0 0  Moving slowly or fidgety/restless 0 0 0  Suicidal thoughts 0 0 0  PHQ-9 Score 0 0 0  Difficult doing work/chores  Not difficult at all Not difficult at all       09/24/2023    8:34 AM 04/12/2023    8:05 AM 12/10/2022    8:10 AM 09/19/2022    8:33 AM  GAD 7 : Generalized Anxiety Score  Nervous, Anxious, on Edge 0 0 0 0  Control/stop worrying 0 0 0 0  Worry too much - different things 0 0 0 0  Trouble relaxing 0 0 0 0  Restless 0 0 0 0  Easily annoyed or irritable 0 0 0 0  Afraid - awful might happen 0 0 0 0  Total GAD 7 Score 0 0 0 0  Anxiety Difficulty  Not difficult at all Not difficult at all     Upstream - 09/24/23 7829       Pregnancy Intention Screening   Does the patient want to become pregnant in the next year? No    Does the patient's partner want to become pregnant in the next year? No    Would  the patient like to discuss contraceptive options today? No      Contraception Wrap Up   Current Method Oral Contraceptive    End Method Oral Contraceptive    Contraception Counseling Provided Yes              Examination chaperoned by Malachy Mood LPN   Impression and plan: 1. Encounter for gynecological examination with Papanicolaou smear of cervix (Primary) Pap sent Pap in 3 years if normal Physical in 1 year Mammogram was negative 03/24/24 Labs with PCP Colonoscopy per GI  - Cytology - PAP( [facility])  2. Encounter for surveillance of contraceptive pills Happy with BCP, will refill  Meds ordered this encounter  Medications   Norethin Ace-Eth Estrad-FE (MERZEE) 1-20 MG-MCG(24) CAPS    Sig: Take 1 capsule by mouth daily.    Dispense:  84 capsule    Refill:  4    Supervising Provider:    Duane Lope H [[id number]]     3. Encounter for screening fecal occult blood testing Hemoccult was negative  - POCT occult blood stool  4. Hot flashes  5. Hypertension, unspecified type On Norvasc 5 mg 1 daily, has refills Follow up with PCP  6. Hypothyroidism, unspecified type Labs normal in January with PCP On synthroid 50 mcg 1 daily, has refills

## 2023-09-26 LAB — CYTOLOGY - PAP
Comment: NEGATIVE
Diagnosis: NEGATIVE
High risk HPV: NEGATIVE

## 2023-11-07 ENCOUNTER — Encounter: Payer: Self-pay | Admitting: Family Medicine

## 2023-11-11 ENCOUNTER — Other Ambulatory Visit: Payer: Self-pay

## 2023-11-11 MED ORDER — AMLODIPINE BESYLATE 5 MG PO TABS: 5.0000 mg | ORAL_TABLET | Freq: Every day | ORAL | 3 refills | Status: AC

## 2023-11-11 MED ORDER — LEVOTHYROXINE SODIUM 50 MCG PO TABS: ORAL_TABLET | ORAL | 3 refills | Status: AC

## 2023-11-11 NOTE — Telephone Encounter (Signed)
 Sent to pharmacy

## 2023-12-20 ENCOUNTER — Encounter: Payer: Self-pay | Admitting: Family Medicine

## 2023-12-20 ENCOUNTER — Ambulatory Visit (INDEPENDENT_AMBULATORY_CARE_PROVIDER_SITE_OTHER): Payer: No Typology Code available for payment source | Admitting: Family Medicine

## 2023-12-20 VITALS — BP 138/72 | HR 85 | Ht 64.0 in | Wt 236.0 lb

## 2023-12-20 DIAGNOSIS — E038 Other specified hypothyroidism: Secondary | ICD-10-CM

## 2023-12-20 DIAGNOSIS — I10 Essential (primary) hypertension: Secondary | ICD-10-CM | POA: Diagnosis not present

## 2023-12-20 DIAGNOSIS — E7849 Other hyperlipidemia: Secondary | ICD-10-CM

## 2023-12-20 DIAGNOSIS — E559 Vitamin D deficiency, unspecified: Secondary | ICD-10-CM

## 2023-12-20 DIAGNOSIS — R7301 Impaired fasting glucose: Secondary | ICD-10-CM

## 2023-12-20 NOTE — Patient Instructions (Addendum)
 I appreciate the opportunity to provide care to you today!    Follow up:  6 months  Labs: please stop by the lab today to get your blood drawn (CBC, CMP, TSH, Lipid profile, HgA1c, Vit D)  For a Healthier YOU, I Recommend: Reducing your intake of sugar, sodium, carbohydrates, and saturated fats. Increasing your fiber intake by incorporating more whole grains, fruits, and vegetables into your meals. Setting healthy goals with a focus on lowering your consumption of carbs, sugar, and unhealthy fats. Adding variety to your diet by including a wide range of fruits and vegetables. Cutting back on soda and limiting processed foods as much as possible. Staying active: In addition to taking your weight loss medication, aim for at least 150 minutes of moderate-intensity physical activity each week for optimal results.     Please continue to a heart-healthy diet and increase your physical activities. Try to exercise for at least five days a week.    It was a pleasure to see you and I look forward to continuing to work together on your health and well-being. Please do not hesitate to call the office if you need care or have questions about your care.  In case of emergency, please visit the Emergency Department for urgent care, or contact our clinic at [phone] to schedule an appointment. We're here to help you!   Have a wonderful day and week. With Gratitude, Hasani Diemer MSN, FNP-BC

## 2023-12-20 NOTE — Progress Notes (Signed)
 Established Patient Office Visit  Subjective:  Patient ID: Tracy Marks, female    DOB: 05-Jan-1976  Age: 48 y.o. MRN: [id number]  CC:  Chief Complaint  Patient presents with   Medical Management of Chronic Issues    4 month f/u    HPI Tracy Marks is a 48 y.o. female with past medical history of  Hypertension, hypothyroidism, obesity presents for f/u of  chronic medical conditions. For the details of today's visit, please refer to the assessment and plan.     Past Medical History:  Diagnosis Date   Contraceptive management 06/17/2013   Fibroids 08/26/2015   Heavy periods 07/01/2015   Hot flashes 07/01/2015   Hyperlipidemia    Hypertension    Obesity    Sinusitis    and seasonal allergies    Past Surgical History:  Procedure Laterality Date   CESAREAN SECTION  10/25/1994   CHOLECYSTECTOMY  12/2005   COLONOSCOPY N/A 12/22/2020   Procedure: COLONOSCOPY;  Surgeon: Ruby Corporal, MD;  Location: [facility];  Service: Endoscopy;  Laterality: N/A;  am    Family History  Problem Relation Age of Onset   Heart disease Mother    Hypertension Mother    Diabetes Mother    Hyperlipidemia Father    Hypertension Father    Hypertension Maternal Grandmother    Arthritis Maternal Grandmother    Ulcers Maternal Grandfather    Breast cancer Neg Hx    Colon cancer Neg Hx     Social History   Socioeconomic History   Marital status: Married    Spouse name: Not on file   Number of children: 1   Years of education: Not on file   Highest education level: Some college, no degree  Occupational History   Not on file  Tobacco Use   Smoking status: Never   Smokeless tobacco: Never  Vaping Use   Vaping status: Never Used  Substance and Sexual Activity   Alcohol use: No   Drug use: No   Sexual activity: Yes    Birth control/protection: Pill  Other Topics Concern   Not on file  Social History Narrative   Lives with her spouse, works for [employer]    Social Drivers of Health   Financial Resource Strain: Low Risk  (09/24/2023)   Overall Financial Resource Strain (CARDIA)    Difficulty of Paying Living Expenses: Not hard at all  Food Insecurity: No Food Insecurity (09/24/2023)   Hunger Vital Sign    Worried About Running Out of Food in the Last Year: Never true    Ran Out of Food in the Last Year: Never true  Transportation Needs: No Transportation Needs (09/24/2023)   PRAPARE - Administrator, Civil Service (Medical): No    Lack of Transportation (Non-Medical): No  Physical Activity: Insufficiently Active (09/24/2023)   Exercise Vital Sign    Days of Exercise per Week: 3 days    Minutes of Exercise per Session: 30 min  Stress: No Stress Concern Present (09/24/2023)   [employer] of Occupational Health - Occupational Stress Questionnaire    Feeling of Stress : Not at all  Social Connections: Moderately Integrated (09/24/2023)   Social Connection and Isolation Panel [NHANES]    Frequency of Communication with Friends and Family: More than three times a week    Frequency of Social Gatherings with Friends and Family: More than three times a week    Attends Religious Services: 1 to 4 times per  year    Active Member of Clubs or Organizations: No    Attends Banker Meetings: Never    Marital Status: Married  Catering manager Violence: Not At Risk (09/24/2023)   Humiliation, Afraid, Rape, and Kick questionnaire    Fear of Current or Ex-Partner: No    Emotionally Abused: No    Physically Abused: No    Sexually Abused: No    Outpatient Medications Prior to Visit  Medication Sig Dispense Refill   amLODipine  (NORVASC ) 5 MG tablet Take 1 tablet (5 mg total) by mouth daily. 90 tablet 3   Ferrous Sulfate  (IRON  PO) Take by mouth.     fexofenadine (ALLEGRA) 180 MG tablet Take 180 mg by mouth daily.     levothyroxine  (SYNTHROID ) 50 MCG tablet Take 1 tablet daily before breakfast 90 tablet 3   mometasone (NASONEX)  50 MCG/ACT nasal spray Place 1 spray into the nose daily as needed (allergies).     Multiple Vitamins-Calcium (ONE-A-DAY WOMENS PO) Take 1 tablet by mouth every other day.     Norethin  Ace-Eth Estrad-FE (MERZEE) 1-20 MG-MCG(24) CAPS Take 1 capsule by mouth daily. 84 capsule 4   No facility-administered medications prior to visit.    No Known Allergies  ROS Review of Systems  Constitutional:  Negative for chills and fever.  Eyes:  Negative for visual disturbance.  Respiratory:  Negative for chest tightness and shortness of breath.   Neurological:  Negative for dizziness and headaches.      Objective:     Physical Exam HENT:     Head: Normocephalic.     Mouth/Throat:     Mouth: Mucous membranes are moist.  Cardiovascular:     Rate and Rhythm: Normal rate.     Heart sounds: Normal heart sounds.  Pulmonary:     Effort: Pulmonary effort is normal.     Breath sounds: Normal breath sounds.  Neurological:     Mental Status: She is alert.     BP 138/72 (BP Location: Left Arm)   Pulse 85   Ht 5\' 4"  (1.626 m)   Wt 236 lb (107 kg)   SpO2 98%   BMI 40.51 kg/m  Wt Readings from Last 3 Encounters:  12/20/23 236 lb (107 kg)  09/24/23 238 lb (108 kg)  08/19/23 236 lb (107 kg)    Lab Results  Component Value Date   TSH 4.030 08/19/2023   Lab Results  Component Value Date   WBC 6.0 08/19/2023   HGB 9.4 (L) 08/19/2023   HCT 30.5 (L) 08/19/2023   MCV 97 08/19/2023   PLT 567 (H) 08/19/2023   Lab Results  Component Value Date   NA 138 08/19/2023   K 4.9 08/19/2023   CO2 21 08/19/2023   GLUCOSE 74 08/19/2023   BUN 13 08/19/2023   CREATININE 0.68 08/19/2023   BILITOT <0.2 08/19/2023   ALKPHOS 124 (H) 08/19/2023   AST 16 08/19/2023   ALT 14 08/19/2023   PROT 6.8 08/19/2023   ALBUMIN 3.9 08/19/2023   CALCIUM 9.2 08/19/2023   ANIONGAP 9 04/13/2023   EGFR 107 08/19/2023   Lab Results  Component Value Date   CHOL 194 08/19/2023   Lab Results  Component Value  Date   HDL 67 08/19/2023   Lab Results  Component Value Date   LDLCALC 114 (H) 08/19/2023   Lab Results  Component Value Date   TRIG 69 08/19/2023   Lab Results  Component Value Date   CHOLHDL 2.9 08/19/2023  Lab Results  Component Value Date   HGBA1C 4.8 08/19/2023      Assessment & Plan:  Primary hypertension Assessment & Plan: Controlled the patient reports compliance on amlodipine  5 mg daily and is currently asymptomatic in the clinic today A low-sodium diet of less than 2300 mg daily is recommended, along with increased physical activity of moderate intensity, aiming for 150 minutes weekly. The patient is encouraged to continue with these lifestyle modifications to help manage their blood pressure effectively.  BP Readings from Last 3 Encounters:  12/20/23 138/72  09/24/23 139/84  08/19/23 122/77      Other specified hypothyroidism Assessment & Plan: The patient takes Synthroid  50 MCG daily and reports treatment compliance The patient is encouraged to follow up if experiencing symptoms of hypothyroidism, despite compliance with the current treatment regimen, such as fatigue, weight gain, constipation, dry skin, cold intolerance, hair loss, or depression.  Lab Results  Component Value Date   TSH 4.030 08/19/2023    Lab Results  Component Value Date   TSH 4.030 08/19/2023      IFG (impaired fasting glucose) -     Hemoglobin A1c  Vitamin D  deficiency -     VITAMIN D  25 Hydroxy (Vit-D Deficiency, Fractures)  TSH (thyroid-stimulating hormone deficiency) -     TSH + free T4  Other hyperlipidemia -     Lipid panel -     CMP14+EGFR -     CBC with Differential/Platelet  Note: This chart has been completed using Engineer, civil (consulting) software, and while attempts have been made to ensure accuracy, certain words and phrases may not be transcribed as intended.    Follow-up: Return in about 6 months (around 06/21/2024).   Shevawn Langenberg, FNP

## 2023-12-20 NOTE — Assessment & Plan Note (Signed)
 The patient takes Synthroid  50 MCG daily and reports treatment compliance The patient is encouraged to follow up if experiencing symptoms of hypothyroidism, despite compliance with the current treatment regimen, such as fatigue, weight gain, constipation, dry skin, cold intolerance, hair loss, or depression.  Lab Results  Component Value Date   TSH 4.030 08/19/2023    Lab Results  Component Value Date   TSH 4.030 08/19/2023

## 2023-12-20 NOTE — Assessment & Plan Note (Signed)
 Controlled the patient reports compliance on amlodipine  5 mg daily and is currently asymptomatic in the clinic today A low-sodium diet of less than 2300 mg daily is recommended, along with increased physical activity of moderate intensity, aiming for 150 minutes weekly. The patient is encouraged to continue with these lifestyle modifications to help manage their blood pressure effectively.  BP Readings from Last 3 Encounters:  12/20/23 138/72  09/24/23 139/84  08/19/23 122/77

## 2023-12-21 ENCOUNTER — Ambulatory Visit: Payer: Self-pay | Admitting: Family Medicine

## 2023-12-21 LAB — CBC WITH DIFFERENTIAL/PLATELET
Basophils Absolute: 0 10*3/uL (ref 0.0–0.2)
Basos: 1 %
EOS (ABSOLUTE): 0.1 10*3/uL (ref 0.0–0.4)
Eos: 1 %
Hematocrit: 32.6 % — ABNORMAL LOW (ref 34.0–46.6)
Hemoglobin: 9.7 g/dL — ABNORMAL LOW (ref 11.1–15.9)
Immature Grans (Abs): 0 10*3/uL (ref 0.0–0.1)
Immature Granulocytes: 0 %
Lymphocytes Absolute: 1 10*3/uL (ref 0.7–3.1)
Lymphs: 20 %
MCH: 28.9 pg (ref 26.6–33.0)
MCHC: 29.8 g/dL — ABNORMAL LOW (ref 31.5–35.7)
MCV: 97 fL (ref 79–97)
Monocytes Absolute: 0.5 10*3/uL (ref 0.1–0.9)
Monocytes: 9 %
Neutrophils Absolute: 3.6 10*3/uL (ref 1.4–7.0)
Neutrophils: 69 %
Platelets: 560 10*3/uL — ABNORMAL HIGH (ref 150–450)
RBC: 3.36 x10E6/uL — ABNORMAL LOW (ref 3.77–5.28)
RDW: 12.6 % (ref 11.7–15.4)
WBC: 5.2 10*3/uL (ref 3.4–10.8)

## 2023-12-21 LAB — LIPID PANEL
Chol/HDL Ratio: 2.9 ratio (ref 0.0–4.4)
Cholesterol, Total: 196 mg/dL (ref 100–199)
HDL: 68 mg/dL (ref >39)
LDL Chol Calc (NIH): 115 mg/dL — ABNORMAL HIGH (ref 0–99)
Triglycerides: 70 mg/dL (ref 0–149)
VLDL Cholesterol Cal: 13 mg/dL (ref 5–40)

## 2023-12-21 LAB — CMP14+EGFR
ALT: 25 IU/L (ref 0–32)
AST: 19 IU/L (ref 0–40)
Albumin: 4 g/dL (ref 3.9–4.9)
Alkaline Phosphatase: 148 IU/L — ABNORMAL HIGH (ref 44–121)
BUN/Creatinine Ratio: 17 (ref 9–23)
BUN: 12 mg/dL (ref 6–24)
Bilirubin Total: <0.2 mg/dL (ref 0.0–1.2)
CO2: 19 mmol/L — ABNORMAL LOW (ref 20–29)
Calcium: 9 mg/dL (ref 8.7–10.2)
Chloride: 104 mmol/L (ref 96–106)
Creatinine, Ser: 0.72 mg/dL (ref 0.57–1.00)
Globulin, Total: 3.2 g/dL (ref 1.5–4.5)
Glucose: 77 mg/dL (ref 70–99)
Potassium: 4.6 mmol/L (ref 3.5–5.2)
Sodium: 138 mmol/L (ref 134–144)
Total Protein: 7.2 g/dL (ref 6.0–8.5)
eGFR: 102 mL/min/{1.73_m2} (ref >59)

## 2023-12-21 LAB — HEMOGLOBIN A1C
Est. average glucose Bld gHb Est-mCnc: 88 mg/dL
Hgb A1c MFr Bld: 4.7 % — ABNORMAL LOW (ref 4.8–5.6)

## 2023-12-21 LAB — VITAMIN D 25 HYDROXY (VIT D DEFICIENCY, FRACTURES): Vit D, 25-Hydroxy: 42.6 ng/mL (ref 30.0–100.0)

## 2023-12-21 LAB — TSH+FREE T4
Free T4: 1.1 ng/dL (ref 0.82–1.77)
TSH: 3.85 u[IU]/mL (ref 0.450–4.500)

## 2024-02-14 ENCOUNTER — Other Ambulatory Visit (HOSPITAL_COMMUNITY): Payer: Self-pay | Admitting: Adult Health

## 2024-02-14 DIAGNOSIS — Z1231 Encounter for screening mammogram for malignant neoplasm of breast: Secondary | ICD-10-CM

## 2024-03-25 ENCOUNTER — Encounter: Payer: Self-pay | Admitting: Family Medicine

## 2024-03-26 ENCOUNTER — Encounter (HOSPITAL_COMMUNITY): Payer: Self-pay

## 2024-03-26 ENCOUNTER — Ambulatory Visit (HOSPITAL_COMMUNITY)
Admission: RE | Admit: 2024-03-26 | Discharge: 2024-03-26 | Disposition: A | Discharge status: Home / Self Care | Source: Ambulatory Visit | Attending: Adult Health | Admitting: Adult Health

## 2024-03-26 DIAGNOSIS — Z1231 Encounter for screening mammogram for malignant neoplasm of breast: Secondary | ICD-10-CM | POA: Diagnosis present

## 2024-04-02 ENCOUNTER — Ambulatory Visit: Payer: Self-pay | Admitting: Adult Health

## 2024-04-14 ENCOUNTER — Other Ambulatory Visit: Payer: Self-pay | Admitting: Adult Health

## 2024-04-14 MED ORDER — NORETHIN ACE-ETH ESTRAD-FE 1-20 MG-MCG PO TABS: 1.0000 | ORAL_TABLET | Freq: Every day | ORAL | 3 refills | Status: AC

## 2024-04-14 NOTE — Progress Notes (Signed)
 Merzee no longer available, will rx Loestrin Fe 1-20

## 2024-06-19 ENCOUNTER — Encounter: Payer: Self-pay | Admitting: Family Medicine

## 2024-06-19 ENCOUNTER — Ambulatory Visit (INDEPENDENT_AMBULATORY_CARE_PROVIDER_SITE_OTHER): Admitting: Family Medicine

## 2024-06-19 VITALS — BP 138/62 | HR 99 | Ht 64.0 in | Wt 234.0 lb

## 2024-06-19 DIAGNOSIS — E038 Other specified hypothyroidism: Secondary | ICD-10-CM | POA: Diagnosis not present

## 2024-06-19 DIAGNOSIS — I10 Essential (primary) hypertension: Secondary | ICD-10-CM | POA: Diagnosis not present

## 2024-06-19 DIAGNOSIS — E782 Mixed hyperlipidemia: Secondary | ICD-10-CM

## 2024-06-19 DIAGNOSIS — R7301 Impaired fasting glucose: Secondary | ICD-10-CM | POA: Diagnosis not present

## 2024-06-19 DIAGNOSIS — Z23 Encounter for immunization: Secondary | ICD-10-CM | POA: Diagnosis not present

## 2024-06-19 DIAGNOSIS — E559 Vitamin D deficiency, unspecified: Secondary | ICD-10-CM

## 2024-06-19 NOTE — Patient Instructions (Signed)
 I appreciate the opportunity to provide care to you today!    Follow up:  4 months  Labs: please stop by the lab today to get your blood drawn (CBC, CMP, TSH, Lipid profile, HgA1c, Vit D)  For a Healthier YOU, I Recommend: Reducing your intake of sugar, sodium, carbohydrates, and saturated fats. Increasing your fiber intake by incorporating more whole grains, fruits, and vegetables into your meals. Setting healthy goals with a focus on lowering your consumption of carbs, sugar, and unhealthy fats. Adding variety to your diet by including a wide range of fruits and vegetables. Cutting back on soda and limiting processed foods as much as possible. Staying active: In addition to taking your weight loss medication, aim for at least 150 minutes of moderate-intensity physical activity each week for optimal results.    Please follow up if your symptoms worsen or fail to improve.    Please continue to a heart-healthy diet and increase your physical activities. Try to exercise for at least five days a week.    It was a pleasure to see you and I look forward to continuing to work together on your health and well-being. Please do not hesitate to call the office if you need care or have questions about your care.  In case of emergency, please visit the Emergency Department for urgent care, or contact our clinic at [phone] to schedule an appointment. We're here to help you!   Have a wonderful day and week. With Gratitude, Meade JENEANE Gerlach MSN, FNP-BC, PMHNP-BC

## 2024-06-19 NOTE — Progress Notes (Unsigned)
 Established Patient Office Visit  Subjective:  Patient ID: Tracy Marks, female    DOB: 1975-10-25  Age: 48 y.o. MRN: [id number]  CC:  Chief Complaint  Patient presents with   Hypertension    Follow up    HPI Tracy Marks is a 48 y.o. female with past medical history of hypertension, hypothyroidism presents for f/u of  chronic medical conditions.  For the details of today's visit, please refer to the assessment and plan.     Past Medical History:  Diagnosis Date   Contraceptive management 06/17/2013   Fibroids 08/26/2015   Heavy periods 07/01/2015   Hot flashes 07/01/2015   Hyperlipidemia    Hypertension    Obesity    Sinusitis    and seasonal allergies    Past Surgical History:  Procedure Laterality Date   CESAREAN SECTION  10/25/1994   CHOLECYSTECTOMY  12/2005   COLONOSCOPY N/A 12/22/2020   Procedure: COLONOSCOPY;  Surgeon: Golda Claudis PENNER, MD;  Location: [facility];  Service: Endoscopy;  Laterality: N/A;  am    Family History  Problem Relation Age of Onset   Heart disease Mother    Hypertension Mother    Diabetes Mother    Hyperlipidemia Father    Hypertension Father    Hypertension Maternal Grandmother    Arthritis Maternal Grandmother    Ulcers Maternal Grandfather    Breast cancer Neg Hx    Colon cancer Neg Hx     Social History   Socioeconomic History   Marital status: Married    Spouse name: Not on file   Number of children: 1   Years of education: Not on file   Highest education level: Associate degree: occupational, scientist, product/process development, or vocational program  Occupational History   Not on file  Tobacco Use   Smoking status: Never   Smokeless tobacco: Never  Vaping Use   Vaping status: Never Used  Substance and Sexual Activity   Alcohol use: No   Drug use: No   Sexual activity: Yes    Birth control/protection: Pill  Other Topics Concern   Not on file  Social History Narrative   Lives with her spouse, works for [employer]    Social Drivers of Health   Financial Resource Strain: Low Risk  (06/18/2024)   Overall Financial Resource Strain (CARDIA)    Difficulty of Paying Living Expenses: Not hard at all  Food Insecurity: No Food Insecurity (06/18/2024)   Hunger Vital Sign    Worried About Running Out of Food in the Last Year: Never true    Ran Out of Food in the Last Year: Never true  Transportation Needs: No Transportation Needs (06/18/2024)   PRAPARE - Administrator, Civil Service (Medical): No    Lack of Transportation (Non-Medical): No  Physical Activity: Insufficiently Active (06/18/2024)   Exercise Vital Sign    Days of Exercise per Week: 2 days    Minutes of Exercise per Session: 60 min  Stress: No Stress Concern Present (06/18/2024)   [employer] of Occupational Health - Occupational Stress Questionnaire    Feeling of Stress: Not at all  Social Connections: Moderately Integrated (06/18/2024)   Social Connection and Isolation Panel    Frequency of Communication with Friends and Family: More than three times a week    Frequency of Social Gatherings with Friends and Family: Once a week    Attends Religious Services: 1 to 4 times per year    Active Member of [employer]  or Organizations: No    Attends Banker Meetings: Not on file    Marital Status: Married  Intimate Partner Violence: Not At Risk (09/24/2023)   Humiliation, Afraid, Rape, and Kick questionnaire    Fear of Current or Ex-Partner: No    Emotionally Abused: No    Physically Abused: No    Sexually Abused: No    Outpatient Medications Prior to Visit  Medication Sig Dispense Refill   amLODipine  (NORVASC ) 5 MG tablet Take 1 tablet (5 mg total) by mouth daily. 90 tablet 3   Ferrous Sulfate  (IRON  PO) Take by mouth.     fexofenadine (ALLEGRA) 180 MG tablet Take 180 mg by mouth daily.     levothyroxine  (SYNTHROID ) 50 MCG tablet Take 1 tablet daily before breakfast 90 tablet 3   mometasone (NASONEX) 50 MCG/ACT  nasal spray Place 1 spray into the nose daily as needed (allergies).     Multiple Vitamins-Calcium (ONE-A-DAY WOMENS PO) Take 1 tablet by mouth every other day.     norethindrone-ethinyl estradiol-FE (LOESTRIN FE) 1-20 MG-MCG tablet Take 1 tablet by mouth daily. 84 tablet 3   No facility-administered medications prior to visit.    No Known Allergies  ROS Review of Systems  Constitutional:  Negative for chills and fever.  Eyes:  Negative for visual disturbance.  Respiratory:  Negative for chest tightness and shortness of breath.   Neurological:  Negative for dizziness and headaches.      Objective:    Physical Exam HENT:     Head: Normocephalic.     Mouth/Throat:     Mouth: Mucous membranes are moist.  Cardiovascular:     Rate and Rhythm: Normal rate.     Heart sounds: Normal heart sounds.  Pulmonary:     Effort: Pulmonary effort is normal.     Breath sounds: Normal breath sounds.  Neurological:     Mental Status: She is alert.     BP 138/62   Pulse 99   Ht 5' 4 (1.626 m)   Wt 234 lb (106.1 kg)   SpO2 100%   BMI 40.17 kg/m  Wt Readings from Last 3 Encounters:  06/19/24 234 lb (106.1 kg)  12/20/23 236 lb (107 kg)  09/24/23 238 lb (108 kg)    Lab Results  Component Value Date   TSH 3.880 06/19/2024   Lab Results  Component Value Date   WBC 5.7 06/19/2024   HGB 7.9 (L) 06/19/2024   HCT 26.9 (L) 06/19/2024   MCV 92 06/19/2024   PLT 640 (H) 06/19/2024   Lab Results  Component Value Date   NA 135 06/19/2024   K 4.6 06/19/2024   CO2 19 (L) 06/19/2024   GLUCOSE 78 06/19/2024   BUN 10 06/19/2024   CREATININE 0.65 06/19/2024   BILITOT 0.2 06/19/2024   ALKPHOS 129 (H) 06/19/2024   AST 18 06/19/2024   ALT 15 06/19/2024   PROT 7.0 06/19/2024   ALBUMIN 3.9 06/19/2024   CALCIUM 9.3 06/19/2024   ANIONGAP 9 04/13/2023   EGFR 107 06/19/2024   Lab Results  Component Value Date   CHOL 201 (H) 06/19/2024   Lab Results  Component Value Date   HDL 72  06/19/2024   Lab Results  Component Value Date   LDLCALC 115 (H) 06/19/2024   Lab Results  Component Value Date   TRIG 78 06/19/2024   Lab Results  Component Value Date   CHOLHDL 2.8 06/19/2024   Lab Results  Component Value Date  HGBA1C 4.7 (L) 06/19/2024      Assessment & Plan:  Primary hypertension Assessment & Plan: Controlled the patient reports compliance on amlodipine  5 mg daily and is currently asymptomatic in the clinic today A low-sodium diet of less than 2300 mg daily is recommended, along with increased physical activity of moderate intensity, aiming for 150 minutes weekly. The patient is encouraged to continue with these lifestyle modifications to help manage their blood pressure effectively.  BP Readings from Last 3 Encounters:  06/19/24 138/62  12/20/23 138/72  09/24/23 139/84      Other specified hypothyroidism Assessment & Plan: The patient takes Synthroid  50 MCG daily and reports treatment compliance The patient is encouraged to follow up if experiencing symptoms of hypothyroidism, despite compliance with the current treatment regimen, such as fatigue, weight gain, constipation, dry skin, cold intolerance, hair loss, or depression.  Lab Results  Component Value Date   TSH 3.880 06/19/2024    Lab Results  Component Value Date   TSH 3.880 06/19/2024      Encounter for immunization Assessment & Plan: Patient educated on CDC recommendation for the vaccine. Verbal consent was obtained from the patient, vaccine administered by nurse, no sign of adverse reactions noted at this time. Patient education on arm soreness and use of tylenol  or ibuprofen  for this patient  was discussed. Patient educated on the signs and symptoms of adverse effect and advise to contact the office if they occur.   Orders: -     Flu vaccine trivalent PF, 6mos and older(Flulaval,Afluria,Fluarix,Fluzone)  IFG (impaired fasting glucose) -     Hemoglobin A1c  Vitamin D   deficiency -     VITAMIN D  25 Hydroxy (Vit-D Deficiency, Fractures)  TSH (thyroid-stimulating hormone deficiency) -     TSH + free T4  Mixed hyperlipidemia -     Lipid panel -     CMP14+EGFR -     CBC with Differential/Platelet  Note: This chart has been completed using Engineer, Civil (consulting) software, and while attempts have been made to ensure accuracy, certain words and phrases may not be transcribed as intended.    Follow-up: Return in about 4 months (around 10/17/2024).   Arno Cullers  Z Bacchus, FNP

## 2024-06-20 ENCOUNTER — Ambulatory Visit: Payer: Self-pay | Admitting: Family Medicine

## 2024-06-22 DIAGNOSIS — Z23 Encounter for immunization: Secondary | ICD-10-CM | POA: Insufficient documentation

## 2024-06-22 LAB — HEMOGLOBIN A1C
Est. average glucose Bld gHb Est-mCnc: 88 mg/dL
Hgb A1c MFr Bld: 4.7 % — AB (ref 4.8–5.6)

## 2024-06-22 LAB — TSH+FREE T4
Free T4: 1.07 ng/dL (ref 0.82–1.77)
TSH: 3.88 u[IU]/mL (ref 0.450–4.500)

## 2024-06-22 LAB — CMP14+EGFR
ALT: 15 IU/L (ref 0–32)
AST: 18 IU/L (ref 0–40)
Albumin: 3.9 g/dL (ref 3.9–4.9)
Alkaline Phosphatase: 129 IU/L — ABNORMAL HIGH (ref 41–116)
BUN/Creatinine Ratio: 15 (ref 9–23)
BUN: 10 mg/dL (ref 6–24)
Bilirubin Total: 0.2 mg/dL (ref 0.0–1.2)
CO2: 19 mmol/L — ABNORMAL LOW (ref 20–29)
Calcium: 9.3 mg/dL (ref 8.7–10.2)
Chloride: 104 mmol/L (ref 96–106)
Creatinine, Ser: 0.65 mg/dL (ref 0.57–1.00)
Globulin, Total: 3.1 g/dL (ref 1.5–4.5)
Glucose: 78 mg/dL (ref 70–99)
Potassium: 4.6 mmol/L (ref 3.5–5.2)
Sodium: 135 mmol/L (ref 134–144)
Total Protein: 7 g/dL (ref 6.0–8.5)
eGFR: 107 mL/min/1.73 (ref >59)

## 2024-06-22 LAB — CBC WITH DIFFERENTIAL/PLATELET
Basophils Absolute: 0.1 x10E3/uL (ref 0.0–0.2)
Basos: 1 %
EOS (ABSOLUTE): 0.1 x10E3/uL (ref 0.0–0.4)
Eos: 2 %
Hematocrit: 26.9 % — ABNORMAL LOW (ref 34.0–46.6)
Hemoglobin: 7.9 g/dL — ABNORMAL LOW (ref 11.1–15.9)
Immature Grans (Abs): 0 x10E3/uL (ref 0.0–0.1)
Immature Granulocytes: 0 %
Lymphocytes Absolute: 1.2 x10E3/uL (ref 0.7–3.1)
Lymphs: 20 %
MCH: 27.1 pg (ref 26.6–33.0)
MCHC: 29.4 g/dL — ABNORMAL LOW (ref 31.5–35.7)
MCV: 92 fL (ref 79–97)
Monocytes Absolute: 0.5 x10E3/uL (ref 0.1–0.9)
Monocytes: 8 %
Neutrophils Absolute: 3.9 x10E3/uL (ref 1.4–7.0)
Neutrophils: 69 %
Platelets: 640 x10E3/uL — ABNORMAL HIGH (ref 150–450)
RBC: 2.92 x10E6/uL — ABNORMAL LOW (ref 3.77–5.28)
RDW: 15.6 % — ABNORMAL HIGH (ref 11.7–15.4)
WBC: 5.7 x10E3/uL (ref 3.4–10.8)

## 2024-06-22 LAB — LIPID PANEL
Chol/HDL Ratio: 2.8 ratio (ref 0.0–4.4)
Cholesterol, Total: 201 mg/dL — ABNORMAL HIGH (ref 100–199)
HDL: 72 mg/dL (ref >39)
LDL Chol Calc (NIH): 115 mg/dL — ABNORMAL HIGH (ref 0–99)
Triglycerides: 78 mg/dL (ref 0–149)
VLDL Cholesterol Cal: 14 mg/dL (ref 5–40)

## 2024-06-22 LAB — VITAMIN D 25 HYDROXY (VIT D DEFICIENCY, FRACTURES): Vit D, 25-Hydroxy: 46.8 ng/mL (ref 30.0–100.0)

## 2024-06-22 NOTE — Assessment & Plan Note (Signed)
 Patient educated on CDC recommendation for the vaccine. Verbal consent was obtained from the patient, vaccine administered by nurse, no sign of adverse reactions noted at this time. Patient education on arm soreness and use of tylenol or ibuprofen for this patient  was discussed. Patient educated on the signs and symptoms of adverse effect and advise to contact the office if they occur.

## 2024-06-22 NOTE — Assessment & Plan Note (Signed)
 Controlled the patient reports compliance on amlodipine  5 mg daily and is currently asymptomatic in the clinic today A low-sodium diet of less than 2300 mg daily is recommended, along with increased physical activity of moderate intensity, aiming for 150 minutes weekly. The patient is encouraged to continue with these lifestyle modifications to help manage their blood pressure effectively.  BP Readings from Last 3 Encounters:  06/19/24 138/62  12/20/23 138/72  09/24/23 139/84

## 2024-06-22 NOTE — Assessment & Plan Note (Signed)
 The patient takes Synthroid  50 MCG daily and reports treatment compliance The patient is encouraged to follow up if experiencing symptoms of hypothyroidism, despite compliance with the current treatment regimen, such as fatigue, weight gain, constipation, dry skin, cold intolerance, hair loss, or depression.  Lab Results  Component Value Date   TSH 3.880 06/19/2024    Lab Results  Component Value Date   TSH 3.880 06/19/2024

## 2024-10-01 ENCOUNTER — Ambulatory Visit: Admitting: Adult Health

## 2024-10-20 ENCOUNTER — Ambulatory Visit: Admitting: Internal Medicine
